# Patient Record
Sex: Male | Born: 1960 | Race: White | Hispanic: No | Marital: Married | Smoking: Never smoker
Health system: Southern US, Community
[De-identification: ages and names within clinical notes are randomized; demographics above are authoritative.]

## PROBLEM LIST (undated history)

## (undated) DIAGNOSIS — K649 Unspecified hemorrhoids: Secondary | ICD-10-CM

## (undated) DIAGNOSIS — R079 Chest pain, unspecified: Secondary | ICD-10-CM

## (undated) DIAGNOSIS — D72819 Decreased white blood cell count, unspecified: Principal | ICD-10-CM

## (undated) DIAGNOSIS — R0602 Shortness of breath: Secondary | ICD-10-CM

## (undated) DIAGNOSIS — M1990 Unspecified osteoarthritis, unspecified site: Secondary | ICD-10-CM

## (undated) DIAGNOSIS — G4730 Sleep apnea, unspecified: Secondary | ICD-10-CM

## (undated) DIAGNOSIS — D509 Iron deficiency anemia, unspecified: Secondary | ICD-10-CM

## (undated) HISTORY — PX: NASAL SINUS SURGERY: SHX719

## (undated) HISTORY — PX: TONSILLECTOMY: SUR1361

## (undated) HISTORY — DX: Iron deficiency anemia, unspecified: D50.9

## (undated) HISTORY — DX: Decreased white blood cell count, unspecified: D72.819

## (undated) HISTORY — DX: Unspecified hemorrhoids: K64.9

## (undated) HISTORY — PX: ROTATOR CUFF REPAIR: SHX139

## (undated) HISTORY — PX: OTHER SURGICAL HISTORY: SHX169

---

## 2000-11-22 ENCOUNTER — Emergency Department (HOSPITAL_COMMUNITY): Admission: EM | Admit: 2000-11-22 | Discharge: 2000-11-22 | Payer: Self-pay | Admitting: *Deleted

## 2006-09-08 ENCOUNTER — Encounter: Payer: Self-pay | Admitting: Gastroenterology

## 2006-09-09 ENCOUNTER — Encounter: Payer: Self-pay | Admitting: Gastroenterology

## 2007-01-25 ENCOUNTER — Encounter: Payer: Self-pay | Admitting: Gastroenterology

## 2007-02-02 ENCOUNTER — Encounter: Payer: Self-pay | Admitting: Gastroenterology

## 2007-02-09 ENCOUNTER — Encounter: Payer: Self-pay | Admitting: Gastroenterology

## 2007-03-03 ENCOUNTER — Encounter: Payer: Self-pay | Admitting: Gastroenterology

## 2007-03-23 ENCOUNTER — Encounter: Payer: Self-pay | Admitting: Gastroenterology

## 2007-03-24 ENCOUNTER — Encounter: Payer: Self-pay | Admitting: Gastroenterology

## 2007-04-10 ENCOUNTER — Encounter: Payer: Self-pay | Admitting: Gastroenterology

## 2007-04-18 ENCOUNTER — Encounter: Payer: Self-pay | Admitting: Gastroenterology

## 2007-05-11 ENCOUNTER — Encounter: Payer: Self-pay | Admitting: Gastroenterology

## 2007-05-21 ENCOUNTER — Encounter: Payer: Self-pay | Admitting: Gastroenterology

## 2007-06-08 ENCOUNTER — Encounter: Payer: Self-pay | Admitting: Gastroenterology

## 2007-06-19 ENCOUNTER — Encounter: Payer: Self-pay | Admitting: Gastroenterology

## 2007-06-26 ENCOUNTER — Encounter: Payer: Self-pay | Admitting: Gastroenterology

## 2007-09-18 ENCOUNTER — Encounter: Payer: Self-pay | Admitting: Gastroenterology

## 2007-09-19 ENCOUNTER — Encounter: Payer: Self-pay | Admitting: Gastroenterology

## 2007-10-05 ENCOUNTER — Encounter: Payer: Self-pay | Admitting: Gastroenterology

## 2007-10-13 ENCOUNTER — Encounter: Payer: Self-pay | Admitting: Gastroenterology

## 2007-11-08 ENCOUNTER — Encounter: Payer: Self-pay | Admitting: Gastroenterology

## 2008-11-26 ENCOUNTER — Encounter: Payer: Self-pay | Admitting: Gastroenterology

## 2009-02-24 ENCOUNTER — Encounter: Payer: Self-pay | Admitting: Gastroenterology

## 2009-04-07 ENCOUNTER — Ambulatory Visit: Payer: Self-pay | Admitting: Gastroenterology

## 2009-04-07 DIAGNOSIS — K644 Residual hemorrhoidal skin tags: Secondary | ICD-10-CM | POA: Insufficient documentation

## 2009-04-07 DIAGNOSIS — D509 Iron deficiency anemia, unspecified: Secondary | ICD-10-CM | POA: Insufficient documentation

## 2009-04-07 LAB — CONVERTED CEMR LAB
Basophils Absolute: 0.1 10*3/uL (ref 0.0–0.1)
Basophils Relative: 1.3 % (ref 0.0–3.0)
Eosinophils Absolute: 0.1 10*3/uL (ref 0.0–0.7)
Eosinophils Relative: 2.2 % (ref 0.0–5.0)
HCT: 38 % — ABNORMAL LOW (ref 39.0–52.0)
Hemoglobin: 12.1 g/dL — ABNORMAL LOW (ref 13.0–17.0)
Lymphocytes Relative: 31.9 % (ref 12.0–46.0)
Lymphs Abs: 1.4 10*3/uL (ref 0.7–4.0)
MCHC: 31.8 g/dL (ref 30.0–36.0)
MCV: 77.3 fL — ABNORMAL LOW (ref 78.0–100.0)
Monocytes Absolute: 0.6 10*3/uL (ref 0.1–1.0)
Monocytes Relative: 12.6 % — ABNORMAL HIGH (ref 3.0–12.0)
Neutro Abs: 2.2 10*3/uL (ref 1.4–7.7)
Neutrophils Relative %: 52 % (ref 43.0–77.0)
Platelets: 195 10*3/uL (ref 150.0–400.0)
RBC: 4.91 M/uL (ref 4.22–5.81)
RDW: 20.3 % — ABNORMAL HIGH (ref 11.5–14.6)
Tissue Transglutaminase Ab, IgA: 0.5 units (ref <7)
WBC: 4.4 10*3/uL — ABNORMAL LOW (ref 4.5–10.5)

## 2009-04-25 ENCOUNTER — Ambulatory Visit (HOSPITAL_COMMUNITY): Admission: RE | Admit: 2009-04-25 | Discharge: 2009-04-25 | Payer: Self-pay | Admitting: Gastroenterology

## 2009-04-25 ENCOUNTER — Encounter: Payer: Self-pay | Admitting: Gastroenterology

## 2009-05-20 ENCOUNTER — Ambulatory Visit: Payer: Self-pay | Admitting: Gastroenterology

## 2009-09-16 ENCOUNTER — Ambulatory Visit (HOSPITAL_COMMUNITY): Admission: RE | Admit: 2009-09-16 | Discharge: 2009-09-16 | Payer: Self-pay | Admitting: Family Medicine

## 2009-09-26 ENCOUNTER — Ambulatory Visit: Payer: Self-pay | Admitting: Hematology and Oncology

## 2009-09-30 ENCOUNTER — Encounter: Payer: Self-pay | Admitting: Gastroenterology

## 2009-09-30 LAB — CBC & DIFF AND RETIC
BASO%: 0.8 % (ref 0.0–2.0)
EOS%: 2.3 % (ref 0.0–7.0)
HCT: 33.1 % — ABNORMAL LOW (ref 38.4–49.9)
Immature Retic Fract: 17.7 % — ABNORMAL HIGH (ref 0.00–13.40)
MCHC: 29.3 g/dL — ABNORMAL LOW (ref 32.0–36.0)
MONO#: 0.3 10*3/uL (ref 0.1–0.9)
RBC: 4.14 10*6/uL — ABNORMAL LOW (ref 4.20–5.82)
RDW: 21 % — ABNORMAL HIGH (ref 11.0–14.6)
Retic %: 2.4 % — ABNORMAL HIGH (ref 0.50–1.60)
WBC: 3.5 10*3/uL — ABNORMAL LOW (ref 4.0–10.3)
lymph#: 1.2 10*3/uL (ref 0.9–3.3)

## 2009-09-30 LAB — URINALYSIS, MICROSCOPIC - CHCC
Blood: NEGATIVE
Ketones: NEGATIVE mg/dL
Protein: <30 mg/dL
Specific Gravity, Urine: 1.02 (ref 1.003–1.035)

## 2009-10-01 LAB — DIRECT ANTIGLOBULIN TEST (NOT AT ARMC)
DAT (Complement): NEGATIVE
DAT IgG: NEGATIVE

## 2009-10-01 LAB — HAPTOGLOBIN: Haptoglobin: 111 mg/dL (ref 16–200)

## 2009-10-02 LAB — VITAMIN B12: Vitamin B-12: 450 pg/mL (ref 211–911)

## 2009-10-02 LAB — PROTEIN ELECTROPHORESIS, SERUM, WITH REFLEX
Albumin ELP: 59.1 % (ref 55.8–66.1)
Alpha-1-Globulin: 4.2 % (ref 2.9–4.9)
Alpha-2-Globulin: 8.2 % (ref 7.1–11.8)
Beta 2: 5.2 % (ref 3.2–6.5)
Beta Globulin: 8.1 % — ABNORMAL HIGH (ref 4.7–7.2)

## 2009-10-02 LAB — IRON AND TIBC
%SAT: 29 % (ref 20–55)
Iron: 146 ug/dL (ref 42–165)
TIBC: 498 ug/dL — ABNORMAL HIGH (ref 215–435)
UIBC: 352 ug/dL

## 2009-10-02 LAB — COMPREHENSIVE METABOLIC PANEL
Alkaline Phosphatase: 64 U/L (ref 39–117)
CO2: 20 mEq/L (ref 19–32)
Creatinine, Ser: 0.9 mg/dL (ref 0.40–1.50)
Glucose, Bld: 105 mg/dL — ABNORMAL HIGH (ref 70–99)
Total Bilirubin: 0.6 mg/dL (ref 0.3–1.2)

## 2009-10-02 LAB — FERRITIN: Ferritin: 8 ng/mL — ABNORMAL LOW (ref 22–322)

## 2009-10-02 LAB — KAPPA/LAMBDA LIGHT CHAINS: Lambda Free Lght Chn: 2.43 mg/dL (ref 0.57–2.63)

## 2009-10-30 ENCOUNTER — Ambulatory Visit: Payer: Self-pay | Admitting: Hematology and Oncology

## 2009-11-03 ENCOUNTER — Ambulatory Visit (HOSPITAL_COMMUNITY): Admission: RE | Admit: 2009-11-03 | Discharge: 2009-11-03 | Payer: Self-pay | Admitting: Hematology and Oncology

## 2009-11-03 LAB — CBC WITH DIFFERENTIAL/PLATELET
Eosinophils Absolute: 0.1 10*3/uL (ref 0.0–0.5)
LYMPH%: 19.9 % (ref 14.0–49.0)
MCHC: 33.4 g/dL (ref 32.0–36.0)
MCV: 82.9 fL (ref 79.3–98.0)
MONO%: 8.1 % (ref 0.0–14.0)
NEUT#: 3.4 10*3/uL (ref 1.5–6.5)
Platelets: 179 10*3/uL (ref 140–400)
RBC: 4.67 10*6/uL (ref 4.20–5.82)

## 2009-11-03 LAB — IRON AND TIBC
%SAT: 20 % (ref 20–55)
TIBC: 347 ug/dL (ref 215–435)
UIBC: 278 ug/dL

## 2009-11-03 LAB — COMPREHENSIVE METABOLIC PANEL
Alkaline Phosphatase: 63 U/L (ref 39–117)
Creatinine, Ser: 1 mg/dL (ref 0.40–1.50)
Glucose, Bld: 90 mg/dL (ref 70–99)
Sodium: 141 mEq/L (ref 135–145)
Total Bilirubin: 0.8 mg/dL (ref 0.3–1.2)
Total Protein: 7.1 g/dL (ref 6.0–8.3)

## 2009-11-03 LAB — FERRITIN: Ferritin: 307 ng/mL (ref 22–322)

## 2009-11-05 ENCOUNTER — Encounter: Payer: Self-pay | Admitting: Gastroenterology

## 2010-01-29 ENCOUNTER — Ambulatory Visit: Payer: Self-pay | Admitting: Hematology and Oncology

## 2010-02-02 LAB — CBC WITH DIFFERENTIAL/PLATELET
BASO%: 1.2 % (ref 0.0–2.0)
EOS%: 1.7 % (ref 0.0–7.0)
MCH: 30.8 pg (ref 27.2–33.4)
MCHC: 34.8 g/dL (ref 32.0–36.0)
RBC: 4.14 10*6/uL — ABNORMAL LOW (ref 4.20–5.82)
RDW: 14.2 % (ref 11.0–14.6)
lymph#: 1.2 10*3/uL (ref 0.9–3.3)

## 2010-02-03 LAB — IRON AND TIBC
TIBC: 392 ug/dL (ref 215–435)
UIBC: 103 ug/dL

## 2010-02-04 ENCOUNTER — Encounter: Payer: Self-pay | Admitting: Gastroenterology

## 2010-04-30 ENCOUNTER — Ambulatory Visit: Payer: Self-pay | Admitting: Hematology and Oncology

## 2010-05-04 LAB — CBC WITH DIFFERENTIAL/PLATELET
Basophils Absolute: 0 10*3/uL (ref 0.0–0.1)
EOS%: 1.5 % (ref 0.0–7.0)
HCT: 40.8 % (ref 38.4–49.9)
HGB: 14.3 g/dL (ref 13.0–17.1)
LYMPH%: 32.1 % (ref 14.0–49.0)
MCH: 31.1 pg (ref 27.2–33.4)
MCV: 88.7 fL (ref 79.3–98.0)
MONO%: 9.8 % (ref 0.0–14.0)
NEUT%: 55.7 % (ref 39.0–75.0)

## 2010-05-04 LAB — VITAMIN B12: Vitamin B-12: 435 pg/mL (ref 211–911)

## 2010-06-15 ENCOUNTER — Ambulatory Visit (HOSPITAL_BASED_OUTPATIENT_CLINIC_OR_DEPARTMENT_OTHER): Payer: Self-pay | Admitting: Hematology and Oncology

## 2010-06-17 ENCOUNTER — Encounter: Payer: Self-pay | Admitting: Gastroenterology

## 2010-06-17 LAB — CBC WITH DIFFERENTIAL/PLATELET
BASO%: 0.7 % (ref 0.0–2.0)
EOS%: 1.1 % (ref 0.0–7.0)
Eosinophils Absolute: 0.1 10*3/uL (ref 0.0–0.5)
LYMPH%: 31.5 % (ref 14.0–49.0)
MCH: 30 pg (ref 27.2–33.4)
MCHC: 34.2 g/dL (ref 32.0–36.0)
MCV: 87.5 fL (ref 79.3–98.0)
MONO%: 7.4 % (ref 0.0–14.0)
NEUT#: 2.6 10*3/uL (ref 1.5–6.5)
Platelets: 203 10*3/uL (ref 140–400)
RBC: 4.24 10*6/uL (ref 4.20–5.82)
RDW: 13 % (ref 11.0–14.6)

## 2010-06-17 LAB — COMPREHENSIVE METABOLIC PANEL
AST: 24 U/L (ref 0–37)
Albumin: 4.5 g/dL (ref 3.5–5.2)
Alkaline Phosphatase: 75 U/L (ref 39–117)
Potassium: 4.1 mEq/L (ref 3.5–5.3)
Sodium: 141 mEq/L (ref 135–145)
Total Bilirubin: 0.6 mg/dL (ref 0.3–1.2)
Total Protein: 7.1 g/dL (ref 6.0–8.3)

## 2010-06-17 LAB — IRON AND TIBC
%SAT: 22 % (ref 20–55)
TIBC: 356 ug/dL (ref 215–435)

## 2010-08-18 NOTE — Letter (Signed)
Summary: [facility]  [facility]   Imported By: Sherian Rein 04/09/2009 13:17:05  _____________________________________________________________________  External Attachment:    Type:   Image     Comment:   External Document

## 2010-08-18 NOTE — Letter (Signed)
[facility]   Imported By: Sherian Rein 11/24/2009 10:07:51  _____________________________________________________________________  External Attachment:    Type:   Image     Comment:   External Document

## 2010-08-18 NOTE — Letter (Signed)
Summary: Letter/[facility]  Letter/[facility]   Imported By: Sherian Rein 04/09/2009 13:33:39  _____________________________________________________________________  External Attachment:    Type:   Image     Comment:   External Document

## 2010-08-18 NOTE — Assessment & Plan Note (Signed)
Summary: EXTERNAL HEMMOROIDS & ANEMIA/YF   History of Present Illness Visit Type: Initial Consult Primary GI MD: Melvia Heaps MD [facility] Primary Provider: Rudi Heap, MD Requesting Provider: Rudi Heap, MD Chief Complaint: Anemia chronic, patient brought all records to be reviewed. Patient is not seeing but a little blood now using the cream Dr. Christell Constant gave him. HX of iron infusions for the anemia. Still having some recal pain.  Andrew Villegas is a pleasant 50 her old white male referred at the request of Dr. Vernon Prey for evaluation of anemia.  He has undergone extensive workup for an iron deficiency anemia including upper and lower endoscopy, small bowel enteroscopy, capsule endoscopy, CT enterography.  Studies  did not demonstrate any specific source for GI bleeding.  This is an ongoing problem for which has been treated for years.  With supplemental iron his counts have increased.  He is taking both iron infusions and oral iron.  His main GI complaint is intermittent rectal bleeding from hemorrhoids.  This may occur one  to 2  times a week.  He is without rectal pain.  He has undergone hematologic  evaluation which concluded that he has iron deficiency.  He is on no gastric irritants including nonsteroidals.  In August, 2010 hemoglobin was 7.8.  Although transfusion was recommended he declined and chose to increase his oral iron.   GI Review of Systems      Denies abdominal pain, acid reflux, belching, bloating, chest pain, dysphagia with liquids, dysphagia with solids, heartburn, loss of appetite, nausea, vomiting, vomiting blood, weight loss, and  weight gain.      Reports hemorrhoids, rectal bleeding, and  rectal pain.     Denies anal fissure, black tarry stools, change in bowel habit, constipation, diarrhea, diverticulosis, fecal incontinence, heme positive stool, irritable bowel syndrome, jaundice, light color stool, and  liver problems. Preventive Screening-Counseling &  Management  Alcohol-Tobacco     Smoking Status: quit  Caffeine-Diet-Exercise     Does Patient Exercise: yes      Drug Use:  no.      Current Medications (verified): 1)  Iron 325 (65 Fe) Mg Tabs (Ferrous Sulfate) .... Take One By Mouth Three Times A Day 2)  Vit C .... Take One By Mouth Once Daily 3)  Vit B12 .... Take One By Mouth Once Daily 4)  Rectal Cream .... As Needed  Allergies (verified): 1)  ! Pcn  Past History:  Past Medical History: external hemorrhoids chronic anemia sleep apena  Past Surgical History: Tonsillectomy left rotator cuff surgery sinus surgery  Family History: Family History of Diabetes: mother, maternal aunts and maternal uncles Family History of Heart Disease: maternal grandmother Family History Brain Cancer: Mother Family History of Colon Cancer:maternal grandfather Family History Lung Cancer: Maternal grandfather  Social History: Occupation: retired Hotel manager Married one son Patient is a former smoker.  Alcohol Use - yes one drink Daily Caffeine Use three per day Illicit Drug Use - no Patient gets regular exercise. Smoking Status:  quit Drug Use:  no Does Patient Exercise:  yes  Review of Systems       The patient complains of allergy/sinus, back pain, change in vision, fatigue, and swelling of feet/legs.  The patient denies anemia, anxiety-new, arthritis/joint pain, blood in urine, breast changes/lumps, confusion, cough, coughing up blood, depression-new, fever, headaches-new, hearing problems, heart murmur, heart rhythm changes, menstrual pain, muscle pains/cramps, night sweats, nosebleeds, pregnancy symptoms, shortness of breath, skin rash, sleeping problems, sore throat, swollen lymph glands, thirst -  excessive , urination - excessive , urination changes/pain, urine leakage, vision changes, and voice change.         All other systems were reviewed and were negative   Vital Signs:  Patient profile:   50 year old male Height:       72 inches Weight:      202.6 pounds BMI:     27.58 Pulse rate:   86 / minute Pulse rhythm:   regular BP sitting:   98 / 58  (right arm) Cuff size:   regular  Vitals Entered By: Harlow Mares CMA Duncan Dull) (April 07, 2009 11:54 AM)  Physical Exam  Additional Exam:  He is a well-developed well-nourished male  skin: anicter  HEENT: normocephalic; PEERLA; no nasal or orpharyngeal abnormalities neck: supple nodes: no cervical adenopathy chest: clear cor:  no murmurs, gallops or rubs abd:  bowel sounds normoactive; no abdominal masses, tenderness, organomegaly rectal: no masses; stool heme negative; external hemorrhoids are present ext: no cyanosis, clubbing, or edema skeletal: no gross skeletal abnormalities neuro: alert, oriented x 3; no focal abnormalities    Impression & Recommendations:  Problem # 1:  IRON DEFICIENCY (ICD-280.9)  His iron deficiency is presumably related to chronic occult GI blood loss.  His overt bleeding from hemorrhoids is unlikely to be the source of his anemia.  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  Recommendations #1 continue iron supplementation 3 times a day.  If he cannot maintain his blood counts with supplementary iron I would consider iron infusions.  Provided that his blood counts are stable and adequate no further workup or therapy necessarily has to be done.  Should he not be able to keep up his blood counts then I would consider complete small bowel balloon enteroscopy with consideration of cauterizing any AVMs.  This regimen will require at least monthly checks of his blood counts.  Finally, celiac disease with iron malabsorption is a consideration.  I will check a celiac panel in case it was not done previously.  Orders: TLB-CBC Platelet - w/Differential (85025-CBCD) T-Sprue Panel (Celiac Disease Aby Eval) (83516x3/[address])  Problem # 2:  EXTERNAL HEMORRHOIDS (ICD-455.3)  He has  limited  bleeding from hemorrhoids.  This could be due to his external hemorrhoid or possibly from internal hemorrhoids.  I explained to the patient that band ligation may help bleeding from internal hemorrhoids but that his external hemorrhoids with have to be surgically excised.  Recommendations #1 sigmoidoscopy with band ligation of his internal hemorrhoids.  The patient decided that is worthwhile trying [place] to see whether he can reduce his bleeding.  Failing  this, I will refer him to surgery for excision of his external hemorrhoids.  Orders: TLB-CBC Platelet - w/Differential (85025-CBCD) T-Sprue Panel (Celiac Disease Aby Eval) (83516x3/[address]) ZFLEX with Banding (ZFL with Band)  Patient Instructions: 1)  Hemorrhoids brochure given.  2)  Your procedure is schdeuled for 04/25/2009 at 12:30 at [facility] Endo department 3)  You will go to the basement for labs today 4)  CC Dr. Vernon Prey 5)  CC Dr. Caryn Bee Ridenhour 6)  The medication list was reviewed and reconciled.  All changed / newly prescribed medications were explained.  A complete medication list was provided to the patient / caregiver.

## 2010-08-18 NOTE — Therapy (Signed)
Summary: Date Range: 03-25-99 to 09-08-06  Date Range: 03-25-99 to 09-08-06   Imported By: Sherian Rein 04/09/2009 12:21:58  _____________________________________________________________________  External Attachment:    Type:   Image     Comment:   External Document

## 2010-08-18 NOTE — Letter (Signed)
Summary: NP/[facility]  NP/[facility]   Imported By: Sherian Rein 04/09/2009 13:25:25  _____________________________________________________________________  External Attachment:    Type:   Image     Comment:   External Document

## 2010-08-18 NOTE — Letter (Signed)
[facility]  [facility]   Imported By: Sherian Rein 04/09/2009 13:20:27  _____________________________________________________________________  External Attachment:    Type:   Image     Comment:   External Document

## 2010-08-18 NOTE — Letter (Signed)
Summary: Medical & Work History- Date Range: 02-23-02 to 09-08-06  Medical & Work History- Date Range: 02-23-02 to 09-08-06   Imported By: Sherian Rein 04/09/2009 13:13:23  _____________________________________________________________________  External Attachment:    Type:   Image     Comment:   External Document

## 2010-08-18 NOTE — Letter (Signed)
Summary: Throat & Sleep Apnea/[facility]  Throat & Sleep Apnea/[facility]   Imported By: Sherian Rein 04/09/2009 13:38:19  _____________________________________________________________________  External Attachment:    Type:   Image     Comment:   External Document

## 2010-08-18 NOTE — Procedures (Signed)
Summary: Flex Sig   Flexible Sigmoidoscopy  Procedure date:  04/25/2009  Findings:      Location: [facility] FLEXIBLE SIGMOIDOSCOPY PROCEDURE REPORT  PATIENT:  Andrew Villegas, Andrew Villegas  MR#:  [id number] BIRTHDATE:   1961/05/01, 48 yrs. old   GENDER:   male  ENDOSCOPIST:   [name]. [name], MD Referred by:   PROCEDURE DATE:  04/25/2009 PROCEDURE:  Flexible Sigmoidoscopy with banding ASA CLASS:   Class II INDICATIONS: treatment of hemorrhoids   MEDICATIONS:    Fentanyl 75 mcg IV, Versed 7 mg  DESCRIPTION OF PROCEDURE:   After the risks benefits and alternatives of the procedure were thoroughly explained, informed consent was obtained.  No rectal exam performed. The [id number] ([id number]) endoscope was introduced through the anus and advanced to the descending colon, without limitations.  The quality of the prep was .  The instrument was then slowly withdrawn as the mucosa was fully examined. <<PROCEDUREIMAGES>><<OLD IMAGES>>  Internal hemorrhoids were found (see image001). hemorrhoidal banding 7 bands were placed circumferentially around the anus above the dentate line  The examination was otherwise normal.   Retroflexed views in the rectum revealed no abnormalities.    The scope was then withdrawn from the patient and the procedure terminated.  COMPLICATIONS:   None  ENDOSCOPIC IMPRESSION:  1) Internal hemorrhoids - s/p band ligation  2) Otherwise normal examination. RECOMMENDATIONS:  1) Call the office to schedule a followup office visit for 1 month  REPEAT EXAM:   No   _______________________________ [name]. [name], MD   CC: Rudi Heap, MD

## 2010-08-18 NOTE — Letter (Signed)
Summary: [facility]   Imported By: Sherian Rein 04/09/2009 11:50:10  _____________________________________________________________________  External Attachment:    Type:   Image     Comment:   External Document

## 2010-08-18 NOTE — Letter (Signed)
[facility]   Imported By: Sherian Rein 10/21/2009 12:19:47  _____________________________________________________________________  External Attachment:    Type:   Image     Comment:   External Document

## 2010-08-18 NOTE — Letter (Signed)
[facility]   Imported By: Sherian Rein 02/19/2010 13:25:09  _____________________________________________________________________  External Attachment:    Type:   Image     Comment:   External Document

## 2010-08-20 NOTE — Letter (Signed)
Summary: [facility]  [facility]   Imported By: Sherian Rein 06/25/2010 14:46:15  _____________________________________________________________________  External Attachment:    Type:   Image     Comment:   External Document

## 2010-09-16 ENCOUNTER — Encounter: Payer: Self-pay | Admitting: Hematology and Oncology

## 2010-09-16 DIAGNOSIS — D509 Iron deficiency anemia, unspecified: Secondary | ICD-10-CM

## 2010-09-16 LAB — IRON AND TIBC
%SAT: 13 % — ABNORMAL LOW (ref 20–55)
Iron: 51 ug/dL (ref 42–165)
UIBC: 328 ug/dL

## 2010-09-16 LAB — CBC WITH DIFFERENTIAL/PLATELET
BASO%: 0.8 % (ref 0.0–2.0)
Basophils Absolute: 0 10*3/uL (ref 0.0–0.1)
EOS%: 2.9 % (ref 0.0–7.0)
HCT: 40.6 % (ref 38.4–49.9)
HGB: 13.3 g/dL (ref 13.0–17.1)
MCH: 27.7 pg (ref 27.2–33.4)
MCHC: 32.8 g/dL (ref 32.0–36.0)
MONO#: 0.4 10*3/uL (ref 0.1–0.9)
NEUT%: 52 % (ref 39.0–75.0)
RDW: 12.5 % (ref 11.0–14.6)
WBC: 3.8 10*3/uL — ABNORMAL LOW (ref 4.0–10.3)
lymph#: 1.4 10*3/uL (ref 0.9–3.3)

## 2010-09-16 LAB — BASIC METABOLIC PANEL
BUN: 9 mg/dL (ref 6–23)
Chloride: 103 mEq/L (ref 96–112)
Potassium: 3.8 mEq/L (ref 3.5–5.3)
Sodium: 138 mEq/L (ref 135–145)

## 2010-09-18 ENCOUNTER — Encounter (HOSPITAL_BASED_OUTPATIENT_CLINIC_OR_DEPARTMENT_OTHER): Payer: BC Managed Care – PPO | Admitting: Hematology and Oncology

## 2010-09-18 DIAGNOSIS — K922 Gastrointestinal hemorrhage, unspecified: Secondary | ICD-10-CM

## 2010-09-18 DIAGNOSIS — D500 Iron deficiency anemia secondary to blood loss (chronic): Secondary | ICD-10-CM

## 2010-09-25 ENCOUNTER — Encounter (HOSPITAL_BASED_OUTPATIENT_CLINIC_OR_DEPARTMENT_OTHER): Payer: BC Managed Care – PPO | Admitting: Hematology and Oncology

## 2010-09-25 DIAGNOSIS — D500 Iron deficiency anemia secondary to blood loss (chronic): Secondary | ICD-10-CM

## 2010-09-25 DIAGNOSIS — K922 Gastrointestinal hemorrhage, unspecified: Secondary | ICD-10-CM

## 2010-10-02 ENCOUNTER — Encounter (HOSPITAL_BASED_OUTPATIENT_CLINIC_OR_DEPARTMENT_OTHER): Payer: BC Managed Care – PPO | Admitting: Hematology and Oncology

## 2010-10-02 DIAGNOSIS — K922 Gastrointestinal hemorrhage, unspecified: Secondary | ICD-10-CM

## 2010-10-02 DIAGNOSIS — D500 Iron deficiency anemia secondary to blood loss (chronic): Secondary | ICD-10-CM

## 2010-10-09 LAB — CROSSMATCH
ABO/RH(D): O NEG
Antibody Screen: NEGATIVE

## 2010-11-11 ENCOUNTER — Encounter (HOSPITAL_BASED_OUTPATIENT_CLINIC_OR_DEPARTMENT_OTHER): Payer: BC Managed Care – PPO | Admitting: Hematology and Oncology

## 2010-11-11 ENCOUNTER — Other Ambulatory Visit: Payer: Self-pay | Admitting: Hematology and Oncology

## 2010-11-11 DIAGNOSIS — D509 Iron deficiency anemia, unspecified: Secondary | ICD-10-CM

## 2010-11-11 LAB — CBC WITH DIFFERENTIAL/PLATELET
Basophils Absolute: 0 10*3/uL (ref 0.0–0.1)
Eosinophils Absolute: 0.1 10*3/uL (ref 0.0–0.5)
HCT: 35.3 % — ABNORMAL LOW (ref 38.4–49.9)
HGB: 11.9 g/dL — ABNORMAL LOW (ref 13.0–17.1)
MCV: 88.3 fL (ref 79.3–98.0)
MONO%: 10.7 % (ref 0.0–14.0)
NEUT#: 2 10*3/uL (ref 1.5–6.5)
RDW: 15.2 % — ABNORMAL HIGH (ref 11.0–14.6)
lymph#: 1.2 10*3/uL (ref 0.9–3.3)

## 2010-11-11 LAB — BASIC METABOLIC PANEL
CO2: 25 mEq/L (ref 19–32)
Glucose, Bld: 83 mg/dL (ref 70–99)
Potassium: 3.9 mEq/L (ref 3.5–5.3)
Sodium: 140 mEq/L (ref 135–145)

## 2010-11-11 LAB — IRON AND TIBC: %SAT: 18 % — ABNORMAL LOW (ref 20–55)

## 2010-11-11 LAB — FERRITIN: Ferritin: 121 ng/mL (ref 22–322)

## 2010-11-13 ENCOUNTER — Encounter (HOSPITAL_BASED_OUTPATIENT_CLINIC_OR_DEPARTMENT_OTHER): Payer: BC Managed Care – PPO | Admitting: Hematology and Oncology

## 2010-11-13 DIAGNOSIS — D500 Iron deficiency anemia secondary to blood loss (chronic): Secondary | ICD-10-CM

## 2010-11-13 DIAGNOSIS — K649 Unspecified hemorrhoids: Secondary | ICD-10-CM

## 2011-04-16 ENCOUNTER — Encounter (HOSPITAL_BASED_OUTPATIENT_CLINIC_OR_DEPARTMENT_OTHER): Payer: Federal, State, Local not specified - PPO | Admitting: Hematology and Oncology

## 2011-04-16 ENCOUNTER — Other Ambulatory Visit: Payer: Self-pay | Admitting: Hematology and Oncology

## 2011-04-16 DIAGNOSIS — D509 Iron deficiency anemia, unspecified: Secondary | ICD-10-CM

## 2011-04-16 DIAGNOSIS — K922 Gastrointestinal hemorrhage, unspecified: Secondary | ICD-10-CM

## 2011-04-16 DIAGNOSIS — D500 Iron deficiency anemia secondary to blood loss (chronic): Secondary | ICD-10-CM

## 2011-04-16 LAB — CBC WITH DIFFERENTIAL/PLATELET
Basophils Absolute: 0 10*3/uL (ref 0.0–0.1)
Eosinophils Absolute: 0.1 10*3/uL (ref 0.0–0.5)
HGB: 12.3 g/dL — ABNORMAL LOW (ref 13.0–17.1)
NEUT#: 2.9 10*3/uL (ref 1.5–6.5)
RDW: 13 % (ref 11.0–14.6)
lymph#: 1.4 10*3/uL (ref 0.9–3.3)

## 2011-04-18 LAB — IRON AND TIBC
%SAT: 7 % — ABNORMAL LOW (ref 20–55)
Iron: 31 ug/dL — ABNORMAL LOW (ref 42–165)

## 2011-04-18 LAB — FERRITIN: Ferritin: 8 ng/mL — ABNORMAL LOW (ref 22–322)

## 2011-04-18 LAB — BASIC METABOLIC PANEL
CO2: 27 mEq/L (ref 19–32)
Glucose, Bld: 80 mg/dL (ref 70–99)
Potassium: 4 mEq/L (ref 3.5–5.3)
Sodium: 138 mEq/L (ref 135–145)

## 2011-04-20 ENCOUNTER — Encounter (HOSPITAL_BASED_OUTPATIENT_CLINIC_OR_DEPARTMENT_OTHER): Payer: Federal, State, Local not specified - PPO | Admitting: Hematology and Oncology

## 2011-04-20 DIAGNOSIS — K649 Unspecified hemorrhoids: Secondary | ICD-10-CM

## 2011-04-20 DIAGNOSIS — D500 Iron deficiency anemia secondary to blood loss (chronic): Secondary | ICD-10-CM

## 2011-05-06 ENCOUNTER — Encounter (HOSPITAL_BASED_OUTPATIENT_CLINIC_OR_DEPARTMENT_OTHER): Payer: Federal, State, Local not specified - PPO | Admitting: Hematology and Oncology

## 2011-05-06 DIAGNOSIS — K649 Unspecified hemorrhoids: Secondary | ICD-10-CM

## 2011-05-06 DIAGNOSIS — D500 Iron deficiency anemia secondary to blood loss (chronic): Secondary | ICD-10-CM

## 2011-05-11 ENCOUNTER — Encounter (HOSPITAL_BASED_OUTPATIENT_CLINIC_OR_DEPARTMENT_OTHER): Payer: Federal, State, Local not specified - PPO | Admitting: Hematology and Oncology

## 2011-05-11 DIAGNOSIS — K649 Unspecified hemorrhoids: Secondary | ICD-10-CM

## 2011-05-11 DIAGNOSIS — D500 Iron deficiency anemia secondary to blood loss (chronic): Secondary | ICD-10-CM

## 2011-10-12 ENCOUNTER — Telehealth: Payer: Self-pay | Admitting: Hematology and Oncology

## 2011-10-12 NOTE — Telephone Encounter (Signed)
S/w pt today re appts for 4/2 and 4/5.

## 2011-10-15 ENCOUNTER — Other Ambulatory Visit: Payer: Self-pay | Admitting: Hematology and Oncology

## 2011-10-19 ENCOUNTER — Other Ambulatory Visit (HOSPITAL_BASED_OUTPATIENT_CLINIC_OR_DEPARTMENT_OTHER): Payer: Federal, State, Local not specified - PPO | Admitting: Lab

## 2011-10-19 DIAGNOSIS — D500 Iron deficiency anemia secondary to blood loss (chronic): Secondary | ICD-10-CM

## 2011-10-19 DIAGNOSIS — K649 Unspecified hemorrhoids: Secondary | ICD-10-CM

## 2011-10-19 LAB — CBC WITH DIFFERENTIAL/PLATELET
BASO%: 0.9 % (ref 0.0–2.0)
Basophils Absolute: 0 10*3/uL (ref 0.0–0.1)
EOS%: 1.6 % (ref 0.0–7.0)
HGB: 14.1 g/dL (ref 13.0–17.1)
MCH: 30.1 pg (ref 27.2–33.4)
NEUT#: 2.2 10*3/uL (ref 1.5–6.5)
RBC: 4.68 10*6/uL (ref 4.20–5.82)
RDW: 13.2 % (ref 11.0–14.6)
lymph#: 1 10*3/uL (ref 0.9–3.3)
nRBC: 0 % (ref 0–0)

## 2011-10-19 LAB — IRON AND TIBC
%SAT: 45 % (ref 20–55)
Iron: 165 ug/dL (ref 42–165)
TIBC: 365 ug/dL (ref 215–435)
UIBC: 200 ug/dL (ref 125–400)

## 2011-10-19 LAB — BASIC METABOLIC PANEL
Glucose, Bld: 76 mg/dL (ref 70–99)
Potassium: 3.6 mEq/L (ref 3.5–5.3)
Sodium: 143 mEq/L (ref 135–145)

## 2011-10-19 LAB — FERRITIN: Ferritin: 33 ng/mL (ref 22–322)

## 2011-10-22 ENCOUNTER — Ambulatory Visit (HOSPITAL_BASED_OUTPATIENT_CLINIC_OR_DEPARTMENT_OTHER): Payer: Federal, State, Local not specified - PPO

## 2011-10-22 ENCOUNTER — Encounter: Payer: Self-pay | Admitting: Hematology and Oncology

## 2011-10-22 ENCOUNTER — Ambulatory Visit (HOSPITAL_BASED_OUTPATIENT_CLINIC_OR_DEPARTMENT_OTHER): Payer: Federal, State, Local not specified - PPO | Admitting: Hematology and Oncology

## 2011-10-22 ENCOUNTER — Other Ambulatory Visit: Payer: Self-pay | Admitting: *Deleted

## 2011-10-22 ENCOUNTER — Telehealth: Payer: Self-pay | Admitting: Hematology and Oncology

## 2011-10-22 VITALS — BP 102/61 | HR 65 | Temp 97.2°F | Ht 72.0 in | Wt 198.6 lb

## 2011-10-22 DIAGNOSIS — K649 Unspecified hemorrhoids: Secondary | ICD-10-CM

## 2011-10-22 DIAGNOSIS — D508 Other iron deficiency anemias: Secondary | ICD-10-CM

## 2011-10-22 DIAGNOSIS — K922 Gastrointestinal hemorrhage, unspecified: Secondary | ICD-10-CM

## 2011-10-22 DIAGNOSIS — D509 Iron deficiency anemia, unspecified: Secondary | ICD-10-CM

## 2011-10-22 DIAGNOSIS — D539 Nutritional anemia, unspecified: Secondary | ICD-10-CM

## 2011-10-22 MED ORDER — SODIUM CHLORIDE 0.9 % IV SOLN
Freq: Once | INTRAVENOUS | Status: AC
  Administered 2011-10-22: 11:00:00 via INTRAVENOUS

## 2011-10-22 MED ORDER — SODIUM CHLORIDE 0.9 % IJ SOLN
10.0000 mL | INTRAMUSCULAR | Status: DC | PRN
  Filled 2011-10-22: qty 10

## 2011-10-22 MED ORDER — FE FUM-VIT C-VIT B12-FA 460-60-0.01-1 MG PO CAPS: 1.0000 | ORAL_CAPSULE | Freq: Every day | ORAL | Status: DC

## 2011-10-22 MED ORDER — SODIUM CHLORIDE 0.9 % IV SOLN
1020.0000 mg | Freq: Once | INTRAVENOUS | Status: AC
  Administered 2011-10-22: 1020 mg via INTRAVENOUS
  Filled 2011-10-22: qty 34

## 2011-10-22 NOTE — Progress Notes (Signed)
This office note has been dictated.

## 2011-10-22 NOTE — Telephone Encounter (Signed)
Per michelle to gve the pt his oct 2013 appt schedule

## 2011-10-22 NOTE — Progress Notes (Signed)
CC:   Andrew Villegas, M.D. [name]. [name], MD,FACG  IDENTIFYING STATEMENT:  The patient is a 51 year old man with iron- deficiency anemia who presents for followup.  INTERVAL HISTORY:  The patient was last seen 6 months ago.  He currently reports fatigue.  He has not experienced any rectal bleeding.  He takes Hematogen Forte iron supplementation.  ALLERGIES:  Penicillin.  PAST MEDICAL HISTORY/FAMILY HISTORY/SOCIAL HISTORY:  Unchanged.  REVIEW OF SYSTEMS:  Ten point review of systems negative.  PHYSICAL EXAM:  The patient is a well-appearing, well-nourished man in no distress.  Vitals:  Pulse 65, blood pressure 102/61, temperature 97.2, respirations 18, weight 198.6 pounds.  HEENT:  Head is atraumatic, normocephalic.  Sclerae anicteric.  Mouth moist.  Chest:  Clear.  CVS: Unremarkable.  Abdomen:  Soft, nontender.  Bowel sounds present. Extremities:  No edema or calf tenderness.  LAB DATA:  10/19/2011 white cell count 3.6, hemoglobin 14.1, hematocrit 31.6, platelets 135.  Sodium 143, potassium 3.6, chloride 108, CO2 27, BUN 10, creatinine 0.9, glucose 76, calcium 9.7, iron 165, TIBC 365, saturation 45%, ferritin 33 (8).  IMPRESSION/PLAN:  Andrew Villegas is a 51 year old man with iron-deficiency anemia secondary to gastrointestinal bleeding from hemorrhoids.  He last received Feraheme 6 months ago.  His ferritin levels are a little on the low side and he is agreeable to have his iron stores replenished.  He will follow up in 6 months' time.  He is to continue oral iron.    ______________________________ Laurice Record, M.D. LIO/MEDQ  D:  10/22/2011  T:  10/22/2011  Job:  [id number]

## 2011-11-04 ENCOUNTER — Other Ambulatory Visit: Payer: Self-pay | Admitting: *Deleted

## 2011-11-04 DIAGNOSIS — D509 Iron deficiency anemia, unspecified: Secondary | ICD-10-CM

## 2011-11-04 MED ORDER — POLYSACCHARIDE IRON COMPLEX 150 MG PO CAPS: 150.0000 mg | ORAL_CAPSULE | Freq: Every day | ORAL | Status: DC

## 2012-04-21 ENCOUNTER — Other Ambulatory Visit (HOSPITAL_BASED_OUTPATIENT_CLINIC_OR_DEPARTMENT_OTHER): Payer: Federal, State, Local not specified - PPO | Admitting: Lab

## 2012-04-21 DIAGNOSIS — D539 Nutritional anemia, unspecified: Secondary | ICD-10-CM

## 2012-04-21 LAB — BASIC METABOLIC PANEL (CC13)
CO2: 25 mEq/L (ref 22–29)
Calcium: 9.3 mg/dL (ref 8.4–10.4)
Chloride: 107 mEq/L (ref 98–107)
Creatinine: 1 mg/dL (ref 0.7–1.3)
Glucose: 91 mg/dl (ref 70–99)

## 2012-04-21 LAB — CBC WITH DIFFERENTIAL/PLATELET
BASO%: 1 % (ref 0.0–2.0)
EOS%: 2.9 % (ref 0.0–7.0)
MCH: 30.6 pg (ref 27.2–33.4)
MCHC: 34.2 g/dL (ref 32.0–36.0)
MCV: 89.4 fL (ref 79.3–98.0)
MONO%: 11.4 % (ref 0.0–14.0)
RBC: 4.93 10*6/uL (ref 4.20–5.82)
RDW: 12.7 % (ref 11.0–14.6)
lymph#: 1.1 10*3/uL (ref 0.9–3.3)

## 2012-04-21 LAB — IRON AND TIBC
TIBC: 316 ug/dL (ref 215–435)
UIBC: 217 ug/dL (ref 125–400)

## 2012-04-24 ENCOUNTER — Encounter: Payer: Self-pay | Admitting: *Deleted

## 2012-04-25 ENCOUNTER — Telehealth: Payer: Self-pay | Admitting: Hematology and Oncology

## 2012-04-25 ENCOUNTER — Ambulatory Visit: Payer: Federal, State, Local not specified - PPO

## 2012-04-25 ENCOUNTER — Ambulatory Visit (HOSPITAL_BASED_OUTPATIENT_CLINIC_OR_DEPARTMENT_OTHER): Payer: Federal, State, Local not specified - PPO | Admitting: Hematology and Oncology

## 2012-04-25 ENCOUNTER — Encounter: Payer: Self-pay | Admitting: Hematology and Oncology

## 2012-04-25 VITALS — BP 109/62 | HR 71 | Temp 97.0°F | Resp 20 | Ht 72.0 in | Wt 195.0 lb

## 2012-04-25 DIAGNOSIS — D539 Nutritional anemia, unspecified: Secondary | ICD-10-CM

## 2012-04-25 DIAGNOSIS — K922 Gastrointestinal hemorrhage, unspecified: Secondary | ICD-10-CM

## 2012-04-25 DIAGNOSIS — D500 Iron deficiency anemia secondary to blood loss (chronic): Secondary | ICD-10-CM

## 2012-04-25 NOTE — Patient Instructions (Addendum)
Andrew Villegas  [id number]   [facility] - AFTER VISIT SUMMARY   **RECOMMENDATIONS MADE BY THE CONSULTANT AND ANY TEST    RESULTS WILL BE SENT TO YOUR REFERRING DOCTORS.   YOUR EXAM FINDINGS, LABS AND RESULTS WERE DISCUSSED BY YOUR MD TODAY.  YOU CAN GO TO THE [facility] WEB SITE FOR INSTRUCTIONS ON HOW TO ASSESS MY CHART FOR ADDITIONAL INFORMATION AS NEEDED.  Your Updated drug allergies are: Allergies as of 04/25/2012 - Review Complete 04/25/2012  Allergen Reaction Noted  . Penicillins      Your current list of medications are: Current Outpatient Prescriptions  Medication Sig Dispense Refill  . fluticasone (FLONASE) 50 MCG/ACT nasal spray Place 1 spray into the nose daily. 1 spray each nostril daily.      Marland Kitchen loratadine (CLARITIN) 10 MG tablet Take 10 mg by mouth daily.      . Fe Fum-Vit C-Vit B12-FA (HEMATOGEN FORTE) 460-60-0.01-1 MG CAPS Take 1 capsule by mouth daily.  90 capsule  3  . iron polysaccharides (NIFEREX) 150 MG capsule Take 1 capsule (150 mg total) by mouth daily.  30 capsule  3     INSTRUCTIONS GIVEN AND DISCUSSED:  See attached schedule   SPECIAL INSTRUCTIONS/FOLLOW-UP:  See above.  I acknowledge that I have been informed and understand all the instructions given to me and received a copy.I know to contact the clinic, my physician, or go to the emergency Department if any problems should occur.   I do not have any more questions at this time, but understand that I may call the [facility] at [phone] during business hours should I have any further questions or need assistance in obtaining follow-up care.

## 2012-04-25 NOTE — Progress Notes (Signed)
This office note has been dictated.

## 2012-04-25 NOTE — Progress Notes (Signed)
CC:   Ernestina Penna, M.D. [name]. [name], MD,FACG  IDENTIFYING STATEMENT:  The patient is a 51 year old man with iron- deficiency anemia who presents for followup.  INTERVAL HISTORY:  Mr. Glace was last seen in April of this year and on 10/22/2011 he received Feraheme.  He has no concerns to report at this time.  He has not experienced any further rectal bleeding.  His weight is stable.  He is not taking oral iron supplementation.  MEDICATIONS:  Reviewed and updated.  ALLERGIES:  Penicillin.  PHYSICAL EXAMINATION:  The patient is a well-appearing, well-nourished man in no distress.  Vitals:  Pulse 91, blood pressure 109/62, respirations 20, weight 165 pounds.  HEENT:  Head is atraumatic, normocephalic.  Sclerae anicteric.  Mouth moist.  Chest:  Clear.  CVS: Unremarkable.  Abdomen:  Soft, nontender.  Bowel sounds present. Extremities:  No edema.  LABORATORY FINDINGS:  04/21/2012, white cell count 3.5, hemoglobin 15.1, hematocrit 44.1, platelets 152, ferritin 113 (33), iron 99, TIBC 216, saturation 31%.  Sodium 140, potassium 4.1, chloride 107, CO2 of 25, BUN 11, creatinine 1, calcium 9.3.  IMPRESSION AND PLAN:  Mr. Brannigan is a 51 year old man with iron- deficiency anemia secondary to gastrointestinal bleeding from hemorrhoids.  He was last transfused with Feraheme on 10/22/2011.  His ferritin levels are more than adequate so at this time we will hold iron and he follows up in 4 months' time with labs.    ______________________________ Laurice Record, M.D. LIO/MEDQ  D:  04/25/2012  T:  04/25/2012  Job:  [id number]

## 2012-04-25 NOTE — Telephone Encounter (Signed)
Gave pt appt for March 2014 la before MD visit

## 2012-06-23 ENCOUNTER — Other Ambulatory Visit: Payer: Self-pay | Admitting: Hematology and Oncology

## 2012-08-19 ENCOUNTER — Telehealth: Payer: Self-pay | Admitting: Oncology

## 2012-08-19 ENCOUNTER — Encounter: Payer: Self-pay | Admitting: Oncology

## 2012-08-19 NOTE — Telephone Encounter (Signed)
S/W THE PT REGARDING THE RE-ASSIGNING OF HIS MD FROM DR ODOGWU TO DR HA. PT IS AWARE THAT HE WILL BE RECEIVING A LETTER AND A CALENDAR IN THE MAIL.

## 2012-09-21 ENCOUNTER — Ambulatory Visit (HOSPITAL_BASED_OUTPATIENT_CLINIC_OR_DEPARTMENT_OTHER): Payer: Federal, State, Local not specified - PPO | Admitting: Lab

## 2012-09-21 DIAGNOSIS — D539 Nutritional anemia, unspecified: Secondary | ICD-10-CM

## 2012-09-21 LAB — CBC WITH DIFFERENTIAL/PLATELET
BASO%: 1.2 % (ref 0.0–2.0)
Basophils Absolute: 0 10*3/uL (ref 0.0–0.1)
EOS%: 3.3 % (ref 0.0–7.0)
HCT: 33.4 % — ABNORMAL LOW (ref 38.4–49.9)
HGB: 11.1 g/dL — ABNORMAL LOW (ref 13.0–17.1)
MCH: 27.3 pg (ref 27.2–33.4)
MCHC: 33.4 g/dL (ref 32.0–36.0)
MCV: 81.7 fL (ref 79.3–98.0)
MONO%: 10.4 % (ref 0.0–14.0)
NEUT%: 51.7 % (ref 39.0–75.0)
RDW: 14.1 % (ref 11.0–14.6)
lymph#: 1.4 10*3/uL (ref 0.9–3.3)

## 2012-09-21 LAB — IRON AND TIBC
Iron: 26 ug/dL — ABNORMAL LOW (ref 42–165)
UIBC: 448 ug/dL — ABNORMAL HIGH (ref 125–400)

## 2012-09-21 LAB — BASIC METABOLIC PANEL (CC13)
BUN: 14.4 mg/dL (ref 7.0–26.0)
Chloride: 104 mEq/L (ref 98–107)
Creatinine: 1 mg/dL (ref 0.7–1.3)

## 2012-09-21 LAB — FOLATE: Folate: 12.4 ng/mL

## 2012-09-22 ENCOUNTER — Other Ambulatory Visit: Payer: Federal, State, Local not specified - PPO

## 2012-09-27 ENCOUNTER — Ambulatory Visit (HOSPITAL_BASED_OUTPATIENT_CLINIC_OR_DEPARTMENT_OTHER): Payer: Federal, State, Local not specified - PPO

## 2012-09-27 ENCOUNTER — Ambulatory Visit: Payer: Federal, State, Local not specified - PPO | Admitting: Hematology and Oncology

## 2012-09-27 ENCOUNTER — Encounter: Payer: Self-pay | Admitting: Oncology

## 2012-09-27 ENCOUNTER — Telehealth: Payer: Self-pay | Admitting: Oncology

## 2012-09-27 ENCOUNTER — Ambulatory Visit (HOSPITAL_BASED_OUTPATIENT_CLINIC_OR_DEPARTMENT_OTHER): Payer: Federal, State, Local not specified - PPO | Admitting: Oncology

## 2012-09-27 VITALS — BP 101/64 | HR 70 | Temp 97.3°F | Resp 18 | Ht 72.0 in | Wt 202.8 lb

## 2012-09-27 DIAGNOSIS — D509 Iron deficiency anemia, unspecified: Secondary | ICD-10-CM

## 2012-09-27 DIAGNOSIS — D500 Iron deficiency anemia secondary to blood loss (chronic): Secondary | ICD-10-CM

## 2012-09-27 DIAGNOSIS — R109 Unspecified abdominal pain: Secondary | ICD-10-CM

## 2012-09-27 MED ORDER — SODIUM CHLORIDE 0.9 % IV SOLN
1020.0000 mg | Freq: Once | INTRAVENOUS | Status: AC
  Administered 2012-09-27: 1020 mg via INTRAVENOUS
  Filled 2012-09-27: qty 34

## 2012-09-27 NOTE — Telephone Encounter (Signed)
Gave pt appt for lab and ML March 2015

## 2012-09-27 NOTE — Patient Instructions (Addendum)
Ferumoxytol injection What is this medicine? FERUMOXYTOL is an iron complex. Iron is used to make healthy red blood cells, which carry oxygen and nutrients throughout the body. This medicine is used to treat iron deficiency anemia in people with chronic kidney disease. This medicine may be used for other purposes; ask your health care provider or pharmacist if you have questions. What should I tell my health care provider before I take this medicine? They need to know if you have any of these conditions: -anemia not caused by low iron levels -high levels of iron in the blood -magnetic resonance imaging (MRI) test scheduled -an unusual or allergic reaction to iron, other medicines, foods, dyes, or preservatives -pregnant or trying to get pregnant -breast-feeding How should I use this medicine? This medicine is for infusion into a vein. It is given by a health care professional in a hospital or clinic setting. Talk to your pediatrician regarding the use of this medicine in children. Special care may be needed. Overdosage: If you think you've taken too much of this medicine contact a poison control center or emergency room at once. Overdosage: If you think you have taken too much of this medicine contact a poison control center or emergency room at once. NOTE: This medicine is only for you. Do not share this medicine with others. What if I miss a dose? It is important not to miss your dose. Call your doctor or health care professional if you are unable to keep an appointment. What may interact with this medicine? This medicine may interact with the following medications: -other iron products This list may not describe all possible interactions. Give your health care provider a list of all the medicines, herbs, non-prescription drugs, or dietary supplements you use. Also tell them if you smoke, drink alcohol, or use illegal drugs. Some items may interact with your medicine. What should I watch  for while using this medicine? Visit your doctor or healthcare professional regularly. Tell your doctor or healthcare professional if your symptoms do not start to get better or if they get worse. You may need blood work done while you are taking this medicine. You may need to follow a special diet. Talk to your doctor. Foods that contain iron include: whole grains/cereals, dried fruits, beans, or peas, leafy green vegetables, and organ meats (liver, kidney). What side effects may I notice from receiving this medicine? Side effects that you should report to your doctor or health care professional as soon as possible: -allergic reactions like skin rash, itching or hives, swelling of the face, lips, or tongue -breathing problems -changes in blood pressure -feeling faint or lightheaded, falls -fever or chills -flushing, sweating, or hot feelings -swelling of the ankles or feet Side effects that usually do not require medical attention (Report these to your doctor or health care professional if they continue or are bothersome.): -diarrhea -headache -nausea, vomiting -stomach pain This list may not describe all possible side effects. Call your doctor for medical advice about side effects. You may report side effects to FDA at 1-800-FDA-1088. Where should I keep my medicine? This drug is given in a hospital or clinic and will not be stored at home. NOTE: This sheet is a summary. It may not cover all possible information. If you have questions about this medicine, talk to your doctor, pharmacist, or health care provider.  2012, [employer]. (03/27/2008 9:48:25 PM) 

## 2012-09-27 NOTE — Patient Instructions (Addendum)
1.  Iron deficiency anemia:  Since there is anemia and Iron deficiency. I recommend IV iron in addition to continuing oral iron.  Increase oral iron to twice daily along with OJ or Vit C to improve absorption.  2.  Follow up:  Blood check in 4 and 8 months to ensure improving hemoglobin.  Return visit in about 1 year. 3.  Right flank pain:  CT scan abdomen within 1 week.

## 2012-09-27 NOTE — Progress Notes (Signed)
[facility]  [phone] [phone]   OFFICE PROGRESS NOTE   Cc:  Rudi Heap, MD  DIAGNOSIS:  Iron deficiency anemia due to either chronic hemorrhoid or malabsorption.  He reportedly had negative EGD and colonoscopy in 2010.   CURRENT THERAPY: oral iron once daily and IV iron prn.   INTERVAL HISTORY: Andrew Villegas 52 y.o. male returns for regular follow up by himself.  He used to see Dr. Dalene Carrow who is no longer with the practice.  I assumed his care starting today.  He reported mild fatigue.  However, he is still independent of all activities of daily living.  He has intermittent hematochezia which he attributes to both internal and external hemorrhoids.  He denied any other visible source of bleeding. He complained of right flank pain for the past 3-4 months.  He had chronic back pain in the past which was low in intensity and was bilateral.  Now, the right flank pain has been severe, at time brought him down to the floor.  He saw his PCP about this 3 months ago and had negative UA.  Pain is crampy and sharp.  It is non-radiating.  There is no relation with activity, bowel/bladder activities.   Patient denies fever, anorexia, weight loss, headache, visual changes, confusion, drenching night sweats, palpable lymph node swelling, mucositis, odynophagia, dysphagia, nausea vomiting, jaundice, chest pain, palpitation, shortness of breath, dyspnea on exertion, productive cough, gum bleeding, epistaxis, hematemesis, hemoptysis, abdominal pain, abdominal swelling, early satiety, melena, hematuria, skin rash, spontaneous bleeding, joint swelling, joint pain, heat or cold intolerance, bowel bladder incontinence, focal motor weakness, paresthesia, depression.  She does not have ice pica.    Past Medical History  Diagnosis Date  . Iron deficiency anemia   . Hemorrhoid     No past surgical history on file.  Current Outpatient Prescriptions  Medication Sig  Dispense Refill  . fluticasone (FLONASE) 50 MCG/ACT nasal spray Place 1 spray into the nose daily. 1 spray each nostril daily.      Marland Kitchen loratadine (CLARITIN) 10 MG tablet Take 10 mg by mouth daily.      Marland Kitchen NU-IRON 150 MG capsule TAKE ONE CAPSULE BY MOUTH EVERY DAY  30 capsule  2   No current facility-administered medications for this visit.    ALLERGIES:  is allergic to penicillins.  REVIEW OF SYSTEMS:  The rest of the 14-point review of system was negative.   Filed Vitals:   09/27/12 0906  BP: 101/64  Pulse: 70  Temp: 97.3 F (36.3 C)  Resp: 18   Wt Readings from Last 3 Encounters:  09/27/12 202 lb 12.8 oz (91.989 kg)  04/25/12 195 lb (88.451 kg)  10/22/11 198 lb 9.6 oz (90.084 kg)   ECOG Performance status: 0-1  PHYSICAL EXAMINATION:   General:  well-nourished man, in no acute distress.  Eyes:  no scleral icterus.  ENT:  There were no oropharyngeal lesions.  Neck was without thyromegaly.  Lymphatics:  Negative cervical, supraclavicular or axillary adenopathy.  Respiratory: lungs were clear bilaterally without wheezing or crackles.  Cardiovascular:  Regular rate and rhythm, S1/S2, without murmur, rub or gallop.  There was no pedal edema.  GI:  abdomen was soft, flat, nontender, nondistended, without organomegaly.  Muscoloskeletal:  no spinal tenderness of palpation of vertebral spine.  There was no tenderness to percussion of right flank. There was no palpable mass.   Skin exam was without echymosis, petichae.  Neuro exam was nonfocal.  Patient  was able to get on and off exam table without assistance.  Gait was normal.  Patient was alerted and oriented.  Attention was good.   Language was appropriate.  Mood was normal without depression.  Speech was not pressured.  Thought content was not tangential.     LABORATORY/RADIOLOGY DATA:  Lab Results  Component Value Date   WBC 4.1 09/21/2012   HGB 11.1* 09/21/2012   HCT 33.4* 09/21/2012   PLT 199 09/21/2012   GLUCOSE 88 09/21/2012   ALKPHOS 75  06/17/2010   ALT 30 06/17/2010   AST 24 06/17/2010   NA 138 09/21/2012   K 4.0 09/21/2012   CL 104 09/21/2012   CREATININE 1.0 09/21/2012   BUN 14.4 09/21/2012   CO2 25 09/21/2012    ASSESSMENT AND PLAN:    1.  Iron deficiency anemia:  - Potential causes:  Chronic hemorrhoidal bleed vs. Malabsorptoin.  - Since there is anemia and Iron deficiency. I recommend IV iron in addition to continuing oral iron.  Increase oral iron to twice daily along with OJ or Vit C to improve absorption.   I advised patient to follow up with Dr. [name] to see when he is due to have follow up surveillance colonoscopy since patient thought there was colonic polyps in 2010.  2.  Right flank pain:  Since symptoms has been progressing and persistent, I ordered CT scan abdomen.   3.  Follow up:  Blood check in 4 and 8 months to ensure improving hemoglobin and iron store.  Return visit in about 1 year.

## 2012-09-28 ENCOUNTER — Telehealth: Payer: Self-pay

## 2012-09-28 NOTE — Telephone Encounter (Signed)
I will schedule him for an OV b/c of iron deficiency anemia. Thanks, Rob

## 2012-09-28 NOTE — Telephone Encounter (Signed)
Pt scheduled to see Dr. [name] 10/02/12@3pm . Pt aware of appt date and time.

## 2012-10-02 ENCOUNTER — Other Ambulatory Visit (INDEPENDENT_AMBULATORY_CARE_PROVIDER_SITE_OTHER): Payer: Federal, State, Local not specified - PPO

## 2012-10-02 ENCOUNTER — Encounter: Payer: Self-pay | Admitting: Gastroenterology

## 2012-10-02 ENCOUNTER — Ambulatory Visit (INDEPENDENT_AMBULATORY_CARE_PROVIDER_SITE_OTHER): Payer: Federal, State, Local not specified - PPO | Admitting: Gastroenterology

## 2012-10-02 VITALS — BP 90/60 | HR 75 | Ht 72.0 in | Wt 200.0 lb

## 2012-10-02 DIAGNOSIS — K644 Residual hemorrhoidal skin tags: Secondary | ICD-10-CM

## 2012-10-02 DIAGNOSIS — D509 Iron deficiency anemia, unspecified: Secondary | ICD-10-CM

## 2012-10-02 DIAGNOSIS — R1031 Right lower quadrant pain: Secondary | ICD-10-CM

## 2012-10-02 LAB — COMPREHENSIVE METABOLIC PANEL
ALT: 39 U/L (ref 0–53)
AST: 36 U/L (ref 0–37)
CO2: 26 mEq/L (ref 19–32)
Calcium: 8.9 mg/dL (ref 8.4–10.5)
Chloride: 104 mEq/L (ref 96–112)
GFR: 70.66 mL/min (ref >60.00)
Sodium: 137 mEq/L (ref 135–145)
Total Protein: 6.9 g/dL (ref 6.0–8.3)

## 2012-10-02 NOTE — Assessment & Plan Note (Signed)
Patient continues to have bleeding, presumably from external hemorrhoids. Although not a frequent cause for anemia, persistent bleeding may be contributing.  Recommendations #1 surgical referral for consideration of hemorrhoidectomy

## 2012-10-02 NOTE — Progress Notes (Signed)
History of Present Illness: 52 year old white male with several year history of iron deficiency anemia referred by Dr. Gaylyn Rong for reevaluation.  He has undergone extensive workup in the past including upper and lower endoscopy, small bowel enteroscopy, capsule endoscopy and CT enterography all which were negative. He underwent band ligation of internal hemorrhoids in 2010 because of rectal bleeding. Bleeding significantly improved but has gradually worsened. He continues to have almost daily painless rectal bleeding. Celiac panel was requested in the past but was not done.  He has undergone iron infusions because he probably does not probably absorb iron. He is on no gastric irritants including nonsteroidals although has taking these in the past. He has no other specific GI complaints except for bleeding.    Past Medical History  Diagnosis Date  . Iron deficiency anemia   . Hemorrhoid    History reviewed. No pertinent past surgical history. family history is not on file. Current Outpatient Prescriptions  Medication Sig Dispense Refill  . fluticasone (FLONASE) 50 MCG/ACT nasal spray Place 1 spray into the nose daily. 1 spray each nostril daily.      Marland Kitchen loratadine (CLARITIN) 10 MG tablet Take 10 mg by mouth daily.      Marland Kitchen NU-IRON 150 MG capsule TAKE ONE CAPSULE BY MOUTH EVERY DAY  30 capsule  2   No current facility-administered medications for this visit.   Allergies as of 10/02/2012 - Review Complete 10/02/2012  Allergen Reaction Noted  . Penicillins      reports that he has never smoked. He does not have any smokeless tobacco history on file. His alcohol and drug histories are not on file.     Review of Systems: Pertinent positive and negative review of systems were noted in the above HPI section. All other review of systems were otherwise negative.  Vital signs were reviewed in today's medical record Physical Exam: General: Well developed , well nourished, no acute distress Skin:  anicteric Head: Normocephalic and atraumatic Eyes:  sclerae anicteric, EOMI Ears: Normal auditory acuity Mouth: No deformity or lesions Neck: Supple, no masses or thyromegaly Lungs: Clear throughout to auscultation Heart: Regular rate and rhythm; no murmurs, rubs or bruits Abdomen: Soft, non tender and non distended. No masses, hepatosplenomegaly or hernias noted. Normal Bowel sounds Rectal: There are grade 4 hemorrhoids Musculoskeletal: Symmetrical with no gross deformities  Skin: No lesions on visible extremities Pulses:  Normal pulses noted Extremities: No clubbing, cyanosis, edema or deformities noted Neurological: Alert oriented x 4, grossly nonfocal Cervical Nodes:  No significant cervical adenopathy Inguinal Nodes: No significant inguinal adenopathy Psychological:  Alert and cooperative. Normal mood and affect

## 2012-10-02 NOTE — Assessment & Plan Note (Addendum)
Etiology for iron deficiency has never been established with certainty. Anemia from hemorrhoidal bleeding is somewhat unusual. Malabsorption has been considered although I don't see any specific testing for this. Occult bleeding from AVMs may be operative. Bleeding from a neoplasm is very unlikely.  Recommendations #1 check celiac panel #2 refer to surgery for consideration of hemorrhoidectomy  I will defer endoscopic studies since there is no history of polyps or neoplasm.

## 2012-10-02 NOTE — Patient Instructions (Addendum)
.  Go to the basement for labs today  You have been scheduled for an appointment with Dr Derrell Lolling at [facility]. Your appointment is on 10/13/2012 at 3:45. Please arrive at 3:30pm for registration. Make certain to bring a list of current medications, including any over the counter medications or vitamins. Also bring your co-pay if you have one as well as your insurance cards. [facility] is located at [address], [address]. Should you need to reschedule your appointment, please contact them at [phone].

## 2012-10-05 ENCOUNTER — Encounter (HOSPITAL_COMMUNITY): Payer: Self-pay

## 2012-10-05 ENCOUNTER — Other Ambulatory Visit: Payer: Self-pay | Admitting: *Deleted

## 2012-10-05 ENCOUNTER — Encounter: Payer: Self-pay | Admitting: Oncology

## 2012-10-05 ENCOUNTER — Ambulatory Visit (HOSPITAL_COMMUNITY)
Admission: RE | Admit: 2012-10-05 | Discharge: 2012-10-05 | Disposition: A | Discharge status: Home / Self Care | Payer: Federal, State, Local not specified - PPO | Source: Ambulatory Visit | Attending: Oncology | Admitting: Oncology

## 2012-10-05 DIAGNOSIS — R109 Unspecified abdominal pain: Secondary | ICD-10-CM

## 2012-10-05 LAB — CAROTENE, SERUM: Carotene, Total-Serum: 17 ug/dL (ref 4–51)

## 2012-10-05 MED ORDER — IOHEXOL 300 MG/ML  SOLN
100.0000 mL | Freq: Once | INTRAMUSCULAR | Status: AC | PRN
  Administered 2012-10-05: 100 mL via INTRAVENOUS

## 2012-10-09 ENCOUNTER — Other Ambulatory Visit: Payer: Federal, State, Local not specified - PPO

## 2012-10-09 DIAGNOSIS — R109 Unspecified abdominal pain: Secondary | ICD-10-CM

## 2012-10-10 LAB — RETICULIN ANTIBODIES, IGA W TITER: Reticulin Ab, IgA: NEGATIVE

## 2012-10-10 LAB — GLIADIN ANTIBODIES, SERUM
Gliadin IgA: 6.2 U/mL (ref <20)
Gliadin IgG: 8.5 U/mL (ref <20)

## 2012-10-12 NOTE — Progress Notes (Signed)
Quick Note:  Please inform the patient that lab work was normal and to continue current plan of action ______ 

## 2012-10-13 ENCOUNTER — Ambulatory Visit (INDEPENDENT_AMBULATORY_CARE_PROVIDER_SITE_OTHER): Payer: Federal, State, Local not specified - PPO | Admitting: General Surgery

## 2012-10-16 ENCOUNTER — Ambulatory Visit (INDEPENDENT_AMBULATORY_CARE_PROVIDER_SITE_OTHER): Payer: Federal, State, Local not specified - PPO | Admitting: Surgery

## 2012-10-16 ENCOUNTER — Other Ambulatory Visit: Payer: Self-pay | Admitting: Oncology

## 2012-10-16 ENCOUNTER — Encounter (INDEPENDENT_AMBULATORY_CARE_PROVIDER_SITE_OTHER): Payer: Self-pay | Admitting: Surgery

## 2012-10-16 VITALS — BP 112/74 | HR 72 | Temp 97.4°F | Resp 20 | Ht 69.0 in | Wt 200.0 lb

## 2012-10-16 DIAGNOSIS — K648 Other hemorrhoids: Secondary | ICD-10-CM

## 2012-10-16 DIAGNOSIS — K644 Residual hemorrhoidal skin tags: Secondary | ICD-10-CM

## 2012-10-16 NOTE — Progress Notes (Signed)
Patient ID: Andrew Villegas, male   DOB: March 18, 1961, 52 y.o.   MRN: [id number]  Chief Complaint  Patient presents with  . Other    hemorrhoids    HPI Andrew Villegas is a 52 y.o. male.   HPIThis is a very pleasant gentleman referred by Dr. Melvia Heaps for evaluation of bleeding hemorrhoids. This has been going on for many years. He has a significant history of anemia and has had a thorough workup including upper lower endoscopies and evaluation of the small bowel. He had injection of his internal hemorrhoids in 2010 which did help with the bleeding for several months. He currently has bleeding with every bowel movement. He has some discomfort. He has no issues with continence  Past Medical History  Diagnosis Date  . Iron deficiency anemia   . Hemorrhoid     Past Surgical History  Procedure Laterality Date  . Tonsillectomy    . Rotator cuff repair    . Hemorrhoid surgery      Family History  Problem Relation Age of Onset  . Cancer Mother     brain tumors  . Alzheimer's disease Father     Social History History  Substance Use Topics  . Smoking status: Never Smoker   . Smokeless tobacco: Not on file  . Alcohol Use: 0.6 oz/week    1 Cans of beer per week    Allergies  Allergen Reactions  . Penicillins     Current Outpatient Prescriptions  Medication Sig Dispense Refill  . fluticasone (FLONASE) 50 MCG/ACT nasal spray Place 1 spray into the nose daily. 1 spray each nostril daily.      Marland Kitchen ibuprofen (ADVIL,MOTRIN) 100 MG chewable tablet Chew 100 mg by mouth every 8 (eight) hours as needed for fever.      . loratadine (CLARITIN) 10 MG tablet Take 10 mg by mouth daily.      Marland Kitchen NU-IRON 150 MG capsule TAKE ONE CAPSULE BY MOUTH EVERY DAY  30 capsule  2   No current facility-administered medications for this visit.    Review of Systems Review of Systems  Constitutional: Negative for fever, chills and unexpected weight change.  HENT: Negative for hearing loss, congestion, sore  throat, trouble swallowing and voice change.   Eyes: Negative for visual disturbance.  Respiratory: Negative for cough and wheezing.   Cardiovascular: Negative for chest pain, palpitations and leg swelling.  Gastrointestinal: Positive for blood in stool and anal bleeding. Negative for nausea, vomiting, abdominal pain, diarrhea, constipation, abdominal distention and rectal pain.  Genitourinary: Negative for hematuria and difficulty urinating.  Musculoskeletal: Negative for arthralgias.  Skin: Negative for rash and wound.  Neurological: Negative for seizures, syncope, weakness and headaches.  Hematological: Negative for adenopathy. Does not bruise/bleed easily.  Psychiatric/Behavioral: Negative for confusion.    Blood pressure 112/74, pulse 72, temperature 97.4 F (36.3 C), temperature source Temporal, resp. rate 20, height 5\' 9"  (1.753 m), weight 200 lb (90.719 kg).  Physical Exam Physical Exam  Constitutional: He is oriented to person, place, and time. He appears well-developed and well-nourished. No distress.  HENT:  Head: Normocephalic and atraumatic.  Right Ear: External ear normal.  Left Ear: External ear normal.  Nose: Nose normal.  Mouth/Throat: Oropharynx is clear and moist. No oropharyngeal exudate.  Eyes: Conjunctivae are normal. Pupils are equal, round, and reactive to light. Right eye exhibits no discharge. Left eye exhibits no discharge. No scleral icterus.  Neck: Normal range of motion. Neck supple. No tracheal deviation present.  No thyromegaly present.  Cardiovascular: Normal rate, regular rhythm, normal heart sounds and intact distal pulses.   No murmur heard. Pulmonary/Chest: Effort normal and breath sounds normal. No respiratory distress. He has no wheezes. He has no rales.  Abdominal: Soft. Bowel sounds are normal. He exhibits no distension. There is no tenderness. There is no rebound.  Genitourinary:  Rectal tone is normal. He has large internal and external  hemorrhoids. panendoscopy was performed confirming this  Musculoskeletal: Normal range of motion. He exhibits no edema and no tenderness.  Lymphadenopathy:    He has no cervical adenopathy.  Neurological: He is alert and oriented to person, place, and time.  Skin: Skin is warm and dry. No rash noted. He is not diaphoretic. No erythema.  Psychiatric: His behavior is normal. Judgment normal.    Data Reviewed   Assessment    Bleeding internal and external hemorrhoids     Plan    As he has failed conservative management, operative hemorrhoidectomy is recommended. I discussed this with him in detail. I discussed the risks of surgery which includes but is not limited to bleeding, infection, recurrence, continence issues, etc. He understands and wishes to proceed. Surgery will be scheduled.        Danashia Landers A 10/16/2012, 1:49 PM

## 2012-11-13 ENCOUNTER — Encounter (HOSPITAL_COMMUNITY): Payer: Self-pay | Admitting: Pharmacy Technician

## 2012-11-17 ENCOUNTER — Other Ambulatory Visit: Payer: Self-pay

## 2012-11-17 ENCOUNTER — Encounter (HOSPITAL_COMMUNITY): Payer: Self-pay

## 2012-11-17 ENCOUNTER — Telehealth (INDEPENDENT_AMBULATORY_CARE_PROVIDER_SITE_OTHER): Payer: Self-pay | Admitting: *Deleted

## 2012-11-17 ENCOUNTER — Encounter (HOSPITAL_COMMUNITY)
Admission: RE | Admit: 2012-11-17 | Discharge: 2012-11-17 | Disposition: A | Discharge status: Home / Self Care | Payer: Federal, State, Local not specified - PPO | Source: Ambulatory Visit | Attending: Surgery | Admitting: Surgery

## 2012-11-17 ENCOUNTER — Emergency Department (HOSPITAL_COMMUNITY)
Admission: EM | Admit: 2012-11-17 | Discharge: 2012-11-18 | Disposition: A | Discharge status: Home / Self Care | Payer: Federal, State, Local not specified - PPO | Attending: Emergency Medicine | Admitting: Emergency Medicine

## 2012-11-17 DIAGNOSIS — D649 Anemia, unspecified: Secondary | ICD-10-CM

## 2012-11-17 DIAGNOSIS — R0602 Shortness of breath: Secondary | ICD-10-CM | POA: Insufficient documentation

## 2012-11-17 DIAGNOSIS — Z79899 Other long term (current) drug therapy: Secondary | ICD-10-CM | POA: Insufficient documentation

## 2012-11-17 DIAGNOSIS — Z880 Allergy status to penicillin: Secondary | ICD-10-CM | POA: Insufficient documentation

## 2012-11-17 DIAGNOSIS — K625 Hemorrhage of anus and rectum: Secondary | ICD-10-CM | POA: Insufficient documentation

## 2012-11-17 DIAGNOSIS — Z8739 Personal history of other diseases of the musculoskeletal system and connective tissue: Secondary | ICD-10-CM | POA: Insufficient documentation

## 2012-11-17 DIAGNOSIS — Z8679 Personal history of other diseases of the circulatory system: Secondary | ICD-10-CM | POA: Insufficient documentation

## 2012-11-17 DIAGNOSIS — R42 Dizziness and giddiness: Secondary | ICD-10-CM | POA: Insufficient documentation

## 2012-11-17 HISTORY — DX: Chest pain, unspecified: R07.9

## 2012-11-17 HISTORY — DX: Unspecified osteoarthritis, unspecified site: M19.90

## 2012-11-17 HISTORY — DX: Sleep apnea, unspecified: G47.30

## 2012-11-17 HISTORY — DX: Shortness of breath: R06.02

## 2012-11-17 LAB — ABO/RH: ABO/RH(D): O NEG

## 2012-11-17 LAB — BASIC METABOLIC PANEL
BUN: 11 mg/dL (ref 6–23)
Calcium: 8.9 mg/dL (ref 8.4–10.5)
Chloride: 105 mEq/L (ref 96–112)
Creatinine, Ser: 0.98 mg/dL (ref 0.50–1.35)
GFR calc Af Amer: >90 mL/min (ref >90)

## 2012-11-17 LAB — CBC
Hemoglobin: 6.9 g/dL — CL (ref 13.0–17.0)
MCH: 24.1 pg — ABNORMAL LOW (ref 26.0–34.0)
MCHC: 30.4 g/dL (ref 30.0–36.0)

## 2012-11-17 LAB — PREPARE RBC (CROSSMATCH)

## 2012-11-17 LAB — SURGICAL PCR SCREEN: MRSA, PCR: NEGATIVE

## 2012-11-17 MED ORDER — DIPHENHYDRAMINE HCL 50 MG/ML IJ SOLN
25.0000 mg | Freq: Once | INTRAMUSCULAR | Status: AC
  Administered 2012-11-17: 25 mg via INTRAVENOUS
  Filled 2012-11-17: qty 1

## 2012-11-17 NOTE — ED Provider Notes (Signed)
History     CSN: [id number]  Arrival date & time 11/17/12  1614   First MD Initiated Contact with Patient 11/17/12 1618      Chief Complaint  Patient presents with  . Anemia    (Consider location/radiation/quality/duration/timing/severity/associated sxs/prior treatment) HPI Comments: Patient with history of iron deficiency anemia secondary to bleeding hemorrhoids and possible malabsorption of iron -- presents after having abnormal hemoglobin today. Hemoglobin was 6.9. Patient endorses increasing fatigue, shortness of breath with activity over the past several days. He states that his hemorrhoidal bleeding has been worse than usual. He denies hemoptysis, hematemesis, hematuria, bruising. No fever, nausea, vomiting, abdominal pain, urinary symptoms. Onset of symptoms gradual. Course gradually worsening. Nothing makes symptoms better or worse.  The history is provided by the patient and medical records.    Past Medical History  Diagnosis Date  . Iron deficiency anemia   . Hemorrhoid     BLEEDING FROM HEMORRHOIDS FOR YRS  . Chest pain     HAS CHEST PAINS THAT PT ATTRIBUTES TO HIS ANEMIA --STATES PAST HX OF ABNORMAL EKGS BUT NEGATIVE STRESS TESTS.  Marland Kitchen Shortness of breath     ATTRIBUTES TO THE POLLEN AND TO HIS ANEMIA  . Arthritis     ALL JOINTS HURT  . Sleep apnea     PT STATES TOLD MILD - DID NOT NEED CPAP    Past Surgical History  Procedure Laterality Date  . Tonsillectomy    . Rotator cuff repair Left 2002 OR 2003    STILL HAS SOME PAIN IN SHOULDER  . Rectal polyps removed      Family History  Problem Relation Age of Onset  . Cancer Mother     brain tumors  . Alzheimer's disease Father     History  Substance Use Topics  . Smoking status: Never Smoker   . Smokeless tobacco: Never Used  . Alcohol Use: 0.6 oz/week    1 Cans of beer per week     Comment: every 6 months      Review of Systems  Constitutional: Negative for fever.  HENT: Negative for sore throat  and rhinorrhea.   Eyes: Negative for redness.  Respiratory: Positive for shortness of breath. Negative for cough.   Cardiovascular: Negative for chest pain and leg swelling.  Gastrointestinal: Positive for anal bleeding. Negative for nausea, vomiting, abdominal pain, diarrhea and blood in stool.  Genitourinary: Negative for dysuria.  Musculoskeletal: Negative for myalgias.  Skin: Negative for rash.  Neurological: Positive for light-headedness. Negative for headaches.    Allergies  Penicillins  Home Medications   Current Outpatient Rx  Name  Route  Sig  Dispense  Refill  . acetaminophen (TYLENOL) 500 MG tablet   Oral   Take 500 mg by mouth every 6 (six) hours as needed for pain.         . fluticasone (FLONASE) 50 MCG/ACT nasal spray   Nasal   Place 1 spray into the nose daily.          . iron polysaccharides (NU-IRON) 150 MG capsule   Oral   Take 150 mg by mouth 2 (two) times daily.         Marland Kitchen loratadine (CLARITIN) 10 MG tablet   Oral   Take 10 mg by mouth daily.         . magnesium hydroxide (MILK OF MAGNESIA) 400 MG/5ML suspension   Oral   Take 15 mLs by mouth daily as needed for constipation or heartburn.  BP 124/62  Pulse 92  Temp(Src) 98.8 F (37.1 C) (Oral)  Resp 24  SpO2 100%  Physical Exam  Nursing note and vitals reviewed. Constitutional: He appears well-developed and well-nourished.  HENT:  Head: Normocephalic and atraumatic.  Eyes: Pupils are equal, round, and reactive to light. Right eye exhibits no discharge. Left eye exhibits no discharge. Right conjunctiva is not injected. Left conjunctiva is not injected.  Conjunctiva pale.  Neck: Normal range of motion. Neck supple.  Cardiovascular: Normal rate, regular rhythm and normal heart sounds.   Pulmonary/Chest: Effort normal and breath sounds normal. No respiratory distress. He has no wheezes.  Abdominal: Soft. There is no tenderness.  Genitourinary: Rectal exam shows external  hemorrhoid. Rectal exam shows no internal hemorrhoid, no fissure, no mass, no tenderness and anal tone normal. Guaiac negative stool.  Neurological: He is alert.  Skin: Skin is warm and dry. There is pallor.  Psychiatric: He has a normal mood and affect.    ED Course  Procedures (including critical care time)  Labs Reviewed  BASIC METABOLIC PANEL  OCCULT BLOOD X 1 CARD TO LAB, STOOL  OCCULT BLOOD, POC DEVICE  TYPE AND SCREEN  PREPARE RBC (CROSSMATCH)  ABO/RH   No results found.   1. Anemia     5:06 PM Patient seen and examined. Work-up initiated.   Vital signs reviewed and are as follows: Filed Vitals:   11/17/12 1630  BP: 124/62  Pulse: 92  Temp: 98.8 F (37.1 C)  Resp: 24   5:17 PM D/w Dr. Ethelda Chick who will see. Will transfuse. Pt does not want to be admitted. Will transfuse in ED and likely d/c to home.   6:45 PM Dr. Myna Hidalgo notified that patient is in ED.   8:03 PM Blood transfusion about to be started. Handoff to [facility] who will monitor and d/c when transfusion complete.    MDM  Symptomatic and clinical anemia, refuses admission. Pt has hematologist. Transfused in ED.         Renne Crigler, PA-C 11/17/12 2010

## 2012-11-17 NOTE — Telephone Encounter (Signed)
Received a call from Pat at pre-op who reported patient's Hgb was 6.9.  Informed Magnus Ivan MD of lab results.  Called and spoke with patient who states he has been weak lately but didn't think anything of it.  Patient states he will go on to the ED this evening to be assessed for the Hgb 6.9.

## 2012-11-17 NOTE — Pre-Procedure Instructions (Signed)
HX OF ANEMIA - PT HAS PALE APPEARANCE.  CBC AND EKG WERE DONE TODAY - PREOP.  PT'S VITAL SIGNS 122/64, P 87 R 16 OXYGEN SAT 100%. LAB CALLED PT'S HGB OF 6.9 - CRITICAL LAB VALUE.  KRISTEN AT [facility] NOTIFIED OF CRITICAL LAB HGB 6.9 - KRISTIN NOTIFIED DR. BLACKMAN AND THEN KRISTEN NOTIFIED PT  THAT DR. BLACKMAN WANTED HIM TO GO TO THE ER AND PT VOICED UNDERSTANDING.

## 2012-11-17 NOTE — ED Provider Notes (Signed)
Medical screening examination/treatment/procedure(s) were conducted as a shared visit with non-physician practitioner(s) and myself.  I personally evaluated the patient during the encounter  Doug Sou, MD 11/17/12 2303

## 2012-11-17 NOTE — Patient Instructions (Signed)
YOUR SURGERY IS SCHEDULED AT [facility]  ON:   Monday  5/12  REPORT TO [place] [facility]AY [facility] AT:  5:30 AM      PHONE # FOR SHORT STAY IS [phone]   DO NOT EAT OR DRINK ANYTHING AFTER MIDNIGHT THE NIGHT BEFORE YOUR SURGERY.  YOU MAY BRUSH YOUR TEETH, RINSE OUT YOUR MOUTH--BUT NO WATER, NO FOOD, NO CHEWING GUM, NO MINTS, NO CANDIES, NO CHEWING TOBACCO.  PLEASE TAKE THE FOLLOWING MEDICATIONS THE AM OF YOUR SURGERY WITH A FEW SIPS OF WATER:  CLARITIN.  MAY USE YOUR FLONASE.    DO NOT BRING VALUABLES, MONEY, CREDIT CARDS.  DO NOT WEAR JEWELRY, MAKE-UP, NAIL POLISH AND NO METAL PINS OR CLIPS IN YOUR HAIR. CONTACT LENS, DENTURES / PARTIALS, GLASSES SHOULD NOT BE WORN TO SURGERY AND IN MOST CASES-HEARING AIDS WILL NEED TO BE REMOVED.  BRING YOUR GLASSES CASE, ANY EQUIPMENT NEEDED FOR YOUR CONTACT LENS. FOR PATIENTS ADMITTED TO THE HOSPITAL--CHECK OUT TIME THE DAY OF DISCHARGE IS 11:00 AM.  ALL INPATIENT ROOMS ARE PRIVATE - WITH BATHROOM, TELEPHONE, TELEVISION AND WIFI INTERNET.  IF YOU ARE BEING DISCHARGED THE SAME DAY OF YOUR SURGERY--YOU CAN NOT DRIVE YOURSELF HOME--AND SHOULD NOT GO HOME ALONE BY TAXI OR BUS.  NO DRIVING OR OPERATING MACHINERY FOR 24 HOURS FOLLOWING ANESTHESIA / PAIN MEDICATIONS.  PLEASE MAKE ARRANGEMENTS FOR SOMEONE TO BE WITH YOU AT HOME THE FIRST 24 HOURS AFTER SURGERY. RESPONSIBLE DRIVER'S NAME DOROTHY Strubel  - PT'S WIFE                                               PHONE #   [phone]                               PLEASE READ OVER ANY  FACT SHEETS THAT YOU WERE GIVEN: MRSA INFORMATION, BLOOD TRANSFUSION INFORMATION, INCENTIVE SPIROMETER INFORMATION. FAILURE TO FOLLOW THESE INSTRUCTIONS MAY RESULT IN THE CANCELLATION OF YOUR SURGERY.   PATIENT SIGNATURE_________________________________

## 2012-11-17 NOTE — ED Notes (Signed)
Patient is to have hemorrhoid surgery 6/12. PAtient received a call from preop concerning his hmg=6.8. Patient is extremely pale. Arrived in the ER pushing his friend who is in wheelchair.

## 2012-11-17 NOTE — ED Provider Notes (Signed)
Complains of generalized weakness for the past several weeks. Has had similar episodes in the past when his hemoglobin has become low. He denies shortness of breath denies chest pain denies other complaint. Patient is not acutely ill appearing speaks in paragraphs no distress Patient noted to be anemic hospitalization offer for transfusion which he declines.  Date: 11/17/2012  Rate: 90  Rhythm: normal sinus rhythm  QRS Axis: normal  Intervals: normal  ST/T Wave abnormalities: normal  Conduction Disutrbances: none  Narrative Interpretation: unremarkable    ;  Doug Sou, MD 11/17/12 2302

## 2012-11-18 LAB — TYPE AND SCREEN
ABO/RH(D): O NEG
Unit division: 0

## 2012-11-18 NOTE — ED Provider Notes (Signed)
Patient care assumed from [facility], PA-C at shift change. Patient has received 2 units of RBCs for anemia. Patient tolerated well and has remained afebrile. On my examination patient is well and nontoxic appearing, pallor improved. Heart RRR, lungs CTAB, abdomen without tenderness to palpation; hemodynamically stable. No pain, fever, hypotension, SOB or hypoxia to suggest transfusion reaction. Patient appropriate for discharge with primary care follow up. Indications for ED return provided. Patient states comfort and understanding with this discharge plan and no unaddressed concerns.   Filed Vitals:   11/18/12 0113 11/18/12 0130 11/18/12 0200 11/18/12 0206  BP:  107/56 107/64 115/64  Pulse:  80 82   Temp: 98.8 F (37.1 C)   98.2 F (36.8 C)  TempSrc:    Oral  Resp:  14 16 15   SpO2:  100% 100% 100%     Antony Madura, PA-C 11/18/12 0216

## 2012-11-18 NOTE — ED Provider Notes (Signed)
Medical screening examination/treatment/procedure(s) were conducted as a shared visit with non-physician practitioner(s) and myself.  I personally evaluated the patient during the encounter  Andrew Sakurai, MD 11/18/12 1021 

## 2012-11-18 NOTE — ED Notes (Signed)
1st unit PRBC transfusion , Unit #[id number] stopped , no transfusion reaction noted.

## 2012-11-18 NOTE — ED Notes (Signed)
2nd Unit PRBC transfusion completed without transfusion reaction noted.

## 2012-11-20 ENCOUNTER — Telehealth (INDEPENDENT_AMBULATORY_CARE_PROVIDER_SITE_OTHER): Payer: Self-pay

## 2012-11-20 ENCOUNTER — Telehealth: Payer: Self-pay | Admitting: Oncology

## 2012-11-20 ENCOUNTER — Other Ambulatory Visit: Payer: Self-pay | Admitting: Oncology

## 2012-11-20 DIAGNOSIS — D509 Iron deficiency anemia, unspecified: Secondary | ICD-10-CM

## 2012-11-20 NOTE — Telephone Encounter (Signed)
Patient calling to make [place] aware that he was seen in ED on Friday due to low hemoglobin.  Patient states he rec'd blood Friday.  Patient scheduled for surgery on 11/27/12 and wanted to make sure that he's cleared to proceed with surgery.

## 2012-11-20 NOTE — Telephone Encounter (Signed)
Reviewed recent patient's hemoglobin results with Dr. Magnus Ivan.  Called patient to make aware that he's okay to proceed with surgery.

## 2012-11-21 ENCOUNTER — Telehealth: Payer: Self-pay | Admitting: Family Medicine

## 2012-11-21 ENCOUNTER — Telehealth: Payer: Self-pay | Admitting: Oncology

## 2012-11-21 NOTE — Telephone Encounter (Signed)
Patient given 2 unites of blood at [facility] ED on 11/17/12.  Has a long history of anemia. He is scheduled for a hemorrhoidectomy on 11/27/12 and has been cleared for surgery even with anemia.   Patient is requesting a hospital f/u and CBC to see if his hemoglobin has improved.  Reports feeling somewhat better but still fatigued.   Appt scheduled for 11/23/12 with Ruthell Rummage, FNP.

## 2012-11-23 ENCOUNTER — Ambulatory Visit (INDEPENDENT_AMBULATORY_CARE_PROVIDER_SITE_OTHER): Payer: Federal, State, Local not specified - PPO | Admitting: General Practice

## 2012-11-23 ENCOUNTER — Encounter: Payer: Self-pay | Admitting: General Practice

## 2012-11-23 VITALS — BP 106/67 | HR 58 | Temp 97.2°F | Ht 71.0 in | Wt 204.0 lb

## 2012-11-23 DIAGNOSIS — D649 Anemia, unspecified: Secondary | ICD-10-CM

## 2012-11-23 LAB — POCT CBC
Granulocyte percent: 56.9 %G (ref 37–80)
HCT, POC: 31.4 % — AB (ref 43.5–53.7)
Lymph, poc: 1.4 (ref 0.6–3.4)
MCHC: 30.7 g/dL — AB (ref 31.8–35.4)
MCV: 77.9 fL — AB (ref 80–97)
POC Granulocyte: 2.3 (ref 2–6.9)
Platelet Count, POC: 225 10*3/uL (ref 142–424)
RDW, POC: 17.5 %
WBC: 4 10*3/uL — AB (ref 4.6–10.2)

## 2012-11-23 LAB — ANEMIA PANEL 7
%SAT: 9 % — ABNORMAL LOW (ref 20–55)
Ferritin: 5 ng/mL — ABNORMAL LOW (ref 22–322)
Folate: 11 ng/mL
MCH: 24.6 pg — ABNORMAL LOW (ref 26.0–34.0)
MCV: 78.2 fL (ref 78.0–100.0)
Platelets: 226 10*3/uL (ref 150–400)
RDW: 17.5 % — ABNORMAL HIGH (ref 11.5–15.5)
Retic Ct Pct: 2.1 % (ref 0.4–2.3)
Vitamin B-12: 326 pg/mL (ref 211–911)

## 2012-11-23 MED ORDER — FLUTICASONE PROPIONATE 50 MCG/ACT NA SUSP: 1.0000 | Freq: Every day | NASAL | Status: AC

## 2012-11-23 NOTE — Progress Notes (Signed)
  Subjective:    Patient ID: Andrew Villegas, male    DOB: 1960-09-09, 52 y.o.   MRN: [id number]  HPI Presents today for follow up of hospital visit, on 11/17/12. Hgb was 6.9. Report receiving 2 units of PRBCs and discharge to home. Reports going in for surgery on Monday 11/27/12, hemmoroidectomy.     Review of Systems  Constitutional: Negative for fever and chills.  Respiratory: Negative for chest tightness and shortness of breath.   Cardiovascular: Negative for chest pain.  Genitourinary: Negative for difficulty urinating.  Skin: Positive for pallor.  Neurological: Negative for dizziness and headaches.  Psychiatric/Behavioral: Negative.        Objective:   Physical Exam  Constitutional: He is oriented to person, place, and time. He appears well-developed and well-nourished.  Cardiovascular: Normal rate, regular rhythm and normal heart sounds.   No murmur heard. Pulmonary/Chest: Effort normal and breath sounds normal. No respiratory distress. He exhibits no tenderness.  Abdominal: Soft. Bowel sounds are normal. He exhibits no distension. There is no tenderness. There is no rebound.  Neurological: He is alert and oriented to person, place, and time.  Skin: Skin is warm and dry. There is pallor.  Psychiatric: He has a normal mood and affect.          Assessment & Plan:  Labs pending Continue current medications RTO or Go to nearest ER if symptoms worsen Patient verbalized understanding Coralie Keens, FNP-C

## 2012-11-23 NOTE — Patient Instructions (Signed)
Anemia, Nonspecific  Your exam and blood tests show you are anemic. This means your blood (hemoglobin) level is low. Normal hemoglobin values are 12 to 15 g/dL for females and 14 to 17 g/dL for males. Make a note of your hemoglobin level today. The hematocrit percent is also used to measure anemia. A normal hematocrit is 38% to 46% in females and 42% to 49% in males. Make a note of your hematocrit level today.  CAUSES   Anemia can be due to many different causes.   Excessive bleeding from periods (in women).   Intestinal bleeding.   Poor nutrition.   Kidney, thyroid, liver, and bone marrow diseases.  SYMPTOMS   Anemia can come on suddenly (acute). It can also come on slowly. Symptoms can include:   Minor weakness.   Dizziness.   Palpitations.   Shortness of breath.  Symptoms may be absent until half your hemoglobin is missing if it comes on slowly. Anemia due to acute blood loss from an injury or internal bleeding may require blood transfusion if the loss is severe. Hospital care is needed if you are anemic and there is significant continual blood loss.  TREATMENT    Stool tests for blood (Hemoccult) and additional lab tests are often needed. This determines the best treatment.   Further checking on your condition and your response to treatment is very important. It often takes many weeks to correct anemia.  Depending on the cause, treatment can include:   Supplements of iron.   Vitamins B12 and folic acid.   Hormone medicines.If your anemia is due to bleeding, finding the cause of the blood loss is very important. This will help avoid further problems.  SEEK IMMEDIATE MEDICAL CARE IF:    You develop fainting, extreme weakness, shortness of breath, or chest pain.   You develop heavy vaginal bleeding.   You develop bloody or black, tarry stools or vomit up blood.   You develop a high fever, rash, repeated vomiting, or dehydration.  Document Released: 08/12/2004 Document Revised: 09/27/2011 Document  Reviewed: 05/20/2009  [employer] Patient Information 2013 [employer].

## 2012-11-24 ENCOUNTER — Telehealth: Payer: Self-pay | Admitting: General Practice

## 2012-11-24 NOTE — Telephone Encounter (Signed)
Spoke with patient and informed of lab values. Hgb 9.7. Patient scheduled for surgery on Monday. Patient aware to watch for bleeding and seek medical attention.

## 2012-11-24 NOTE — Pre-Procedure Instructions (Signed)
PER NOTES IN EPIC - PT WAS GIVEN 2 UNITS OF BLOOD IN ER 5/2 AND HAD FOLLOW UP VISIT AT HIS PRIMARY CARE DOCTOR'S OFFICE ON 5/8 - REPEAT CBC AND HGB UP TO 9.7.

## 2012-11-25 NOTE — H&P (Signed)
Patient ID: Andrew Villegas, male DOB: [birth year], 52 y.o. MRN: [id number]  Chief Complaint   Patient presents with   .  Other     hemorrhoids   HPI  Andrew Villegas is a 52 y.o. male.  HPIThis is a very pleasant gentleman referred by Dr. Melvia Heaps for evaluation of bleeding hemorrhoids. This has been going on for many years. He has a significant history of anemia and has had a thorough workup including upper lower endoscopies and evaluation of the small bowel. He had injection of his internal hemorrhoids in 2010 which did help with the bleeding for several months. He currently has bleeding with every bowel movement. He has some discomfort. He has no issues with continence  Past Medical History   Diagnosis  Date   .  Iron deficiency anemia    .  Hemorrhoid     Past Surgical History   Procedure  Laterality  Date   .  Tonsillectomy     .  Rotator cuff repair     .  Hemorrhoid surgery      Family History   Problem  Relation  Age of Onset   .  Cancer  Mother      brain tumors   .  Alzheimer's disease  Father    Social History  History   Substance Use Topics   .  Smoking status:  Never Smoker   .  Smokeless tobacco:  Not on file   .  Alcohol Use:  0.6 oz/week     1 Cans of beer per week    Allergies   Allergen  Reactions   .  Penicillins     Current Outpatient Prescriptions   Medication  Sig  Dispense  Refill   .  fluticasone (FLONASE) 50 MCG/ACT nasal spray  Place 1 spray into the nose daily. 1 spray each nostril daily.     Marland Kitchen  ibuprofen (ADVIL,MOTRIN) 100 MG chewable tablet  Chew 100 mg by mouth every 8 (eight) hours as needed for fever.     .  loratadine (CLARITIN) 10 MG tablet  Take 10 mg by mouth daily.     Marland Kitchen  NU-IRON 150 MG capsule  TAKE ONE CAPSULE BY MOUTH EVERY DAY  30 capsule  2    No current facility-administered medications for this visit.   Review of Systems  Review of Systems  Constitutional: Negative for fever, chills and unexpected weight change.  HENT:  Negative for hearing loss, congestion, sore throat, trouble swallowing and voice change.  Eyes: Negative for visual disturbance.  Respiratory: Negative for cough and wheezing.  Cardiovascular: Negative for chest pain, palpitations and leg swelling.  Gastrointestinal: Positive for blood in stool and anal bleeding. Negative for nausea, vomiting, abdominal pain, diarrhea, constipation, abdominal distention and rectal pain.  Genitourinary: Negative for hematuria and difficulty urinating.  Musculoskeletal: Negative for arthralgias.  Skin: Negative for rash and wound.  Neurological: Negative for seizures, syncope, weakness and headaches.  Hematological: Negative for adenopathy. Does not bruise/bleed easily.  Psychiatric/Behavioral: Negative for confusion.  Blood pressure 112/74, pulse 72, temperature 97.4 F (36.3 C), temperature source Temporal, resp. rate 20, height 5\' 9"  (1.753 m), weight 200 lb (90.719 kg).  Physical Exam  Physical Exam  Constitutional: He is oriented to person, place, and time. He appears well-developed and well-nourished. No distress.  HENT:  Head: Normocephalic and atraumatic.  Right Ear: External ear normal.  Left Ear: External ear normal.  Nose: Nose normal.  Mouth/Throat:  Oropharynx is clear and moist. No oropharyngeal exudate.  Eyes: Conjunctivae are normal. Pupils are equal, round, and reactive to light. Right eye exhibits no discharge. Left eye exhibits no discharge. No scleral icterus.  Neck: Normal range of motion. Neck supple. No tracheal deviation present. No thyromegaly present.  Cardiovascular: Normal rate, regular rhythm, normal heart sounds and intact distal pulses.  No murmur heard.  Pulmonary/Chest: Effort normal and breath sounds normal. No respiratory distress. He has no wheezes. He has no rales.  Abdominal: Soft. Bowel sounds are normal. He exhibits no distension. There is no tenderness. There is no rebound.  Genitourinary:  Rectal tone is normal. He  has large internal and external hemorrhoids. panendoscopy was performed confirming this  Musculoskeletal: Normal range of motion. He exhibits no edema and no tenderness.  Lymphadenopathy:  He has no cervical adenopathy.  Neurological: He is alert and oriented to person, place, and time.  Skin: Skin is warm and dry. No rash noted. He is not diaphoretic. No erythema.  Psychiatric: His behavior is normal. Judgment normal.  Data Reviewed   Assessment Bleeding internal and external hemorrhoids   Plan  As he has failed conservative management, operative hemorrhoidectomy is recommended. I discussed this with him in detail. I discussed the risks of surgery which includes but is not limited to bleeding, infection, recurrence, continence issues, etc. He understands and wishes to proceed. Surgery will be scheduled. It is still difficult to tell whether this is the source of his significant anemia. He recently was found to have a hemoglobin less than 7 and has since had transfusions. He may even require admission after surgery for further transfusions. He also understands that surgery may not change his anemia if there is another source

## 2012-11-27 ENCOUNTER — Ambulatory Visit (HOSPITAL_COMMUNITY): Payer: Federal, State, Local not specified - PPO | Admitting: Anesthesiology

## 2012-11-27 ENCOUNTER — Encounter (HOSPITAL_COMMUNITY): Payer: Self-pay | Admitting: *Deleted

## 2012-11-27 ENCOUNTER — Encounter (HOSPITAL_COMMUNITY): Payer: Self-pay | Admitting: Anesthesiology

## 2012-11-27 ENCOUNTER — Ambulatory Visit (HOSPITAL_COMMUNITY)
Admission: RE | Admit: 2012-11-27 | Discharge: 2012-11-27 | Disposition: A | Discharge status: Home / Self Care | Payer: Federal, State, Local not specified - PPO | Source: Ambulatory Visit | Attending: Surgery | Admitting: Surgery

## 2012-11-27 ENCOUNTER — Encounter (HOSPITAL_COMMUNITY)
Admission: RE | Disposition: A | Discharge status: Home / Self Care | Payer: Self-pay | Source: Ambulatory Visit | Attending: Surgery

## 2012-11-27 DIAGNOSIS — K644 Residual hemorrhoidal skin tags: Secondary | ICD-10-CM

## 2012-11-27 DIAGNOSIS — Z79899 Other long term (current) drug therapy: Secondary | ICD-10-CM | POA: Insufficient documentation

## 2012-11-27 DIAGNOSIS — D649 Anemia, unspecified: Secondary | ICD-10-CM | POA: Insufficient documentation

## 2012-11-27 DIAGNOSIS — K648 Other hemorrhoids: Secondary | ICD-10-CM | POA: Insufficient documentation

## 2012-11-27 HISTORY — PX: HEMORRHOID SURGERY: SHX153

## 2012-11-27 SURGERY — HEMORRHOIDECTOMY
Anesthesia: General | Site: Anus | Wound class: Clean Contaminated

## 2012-11-27 MED ORDER — ACETAMINOPHEN 650 MG RE SUPP
650.0000 mg | RECTAL | Status: DC | PRN
  Filled 2012-11-27: qty 1

## 2012-11-27 MED ORDER — HYDROCODONE-ACETAMINOPHEN 5-325 MG PO TABS: 1.0000 | ORAL_TABLET | ORAL | Status: DC | PRN

## 2012-11-27 MED ORDER — ONDANSETRON HCL 4 MG/2ML IJ SOLN: 4.0000 mg | Freq: Four times a day (QID) | INTRAMUSCULAR | Status: DC | PRN

## 2012-11-27 MED ORDER — FENTANYL CITRATE 0.05 MG/ML IJ SOLN
INTRAMUSCULAR | Status: DC | PRN
  Administered 2012-11-27 (×2): 50 ug via INTRAVENOUS

## 2012-11-27 MED ORDER — ACETAMINOPHEN 10 MG/ML IV SOLN
INTRAVENOUS | Status: DC | PRN
  Administered 2012-11-27: 1000 mg via INTRAVENOUS

## 2012-11-27 MED ORDER — DIBUCAINE 1 % RE OINT
TOPICAL_OINTMENT | RECTAL | Status: AC
  Filled 2012-11-27: qty 28

## 2012-11-27 MED ORDER — ONDANSETRON HCL 4 MG/2ML IJ SOLN
INTRAMUSCULAR | Status: DC | PRN
  Administered 2012-11-27: 4 mg via INTRAVENOUS

## 2012-11-27 MED ORDER — BUPIVACAINE HCL (PF) 0.5 % IJ SOLN
INTRAMUSCULAR | Status: AC
  Filled 2012-11-27: qty 30

## 2012-11-27 MED ORDER — BUPIVACAINE LIPOSOME 1.3 % IJ SUSP
INTRAMUSCULAR | Status: DC | PRN
  Administered 2012-11-27: 20 mL

## 2012-11-27 MED ORDER — KETOROLAC TROMETHAMINE 30 MG/ML IJ SOLN
INTRAMUSCULAR | Status: DC | PRN
  Administered 2012-11-27: 30 mg via INTRAVENOUS

## 2012-11-27 MED ORDER — CIPROFLOXACIN IN D5W 400 MG/200ML IV SOLN: 400.0000 mg | INTRAVENOUS | Status: DC

## 2012-11-27 MED ORDER — CIPROFLOXACIN IN D5W 400 MG/200ML IV SOLN
INTRAVENOUS | Status: DC | PRN
  Administered 2012-11-27: 400 mg via INTRAVENOUS

## 2012-11-27 MED ORDER — ACETAMINOPHEN 325 MG PO TABS: 650.0000 mg | ORAL_TABLET | ORAL | Status: DC | PRN

## 2012-11-27 MED ORDER — CIPROFLOXACIN IN D5W 400 MG/200ML IV SOLN
INTRAVENOUS | Status: AC
  Filled 2012-11-27: qty 200

## 2012-11-27 MED ORDER — MIDAZOLAM HCL 5 MG/5ML IJ SOLN
INTRAMUSCULAR | Status: DC | PRN
  Administered 2012-11-27: 2 mg via INTRAVENOUS

## 2012-11-27 MED ORDER — SODIUM CHLORIDE 0.9 % IJ SOLN: 3.0000 mL | INTRAMUSCULAR | Status: DC | PRN

## 2012-11-27 MED ORDER — 0.9 % SODIUM CHLORIDE (POUR BTL) OPTIME
TOPICAL | Status: DC | PRN
  Administered 2012-11-27: 1000 mL

## 2012-11-27 MED ORDER — OXYCODONE HCL 5 MG PO TABS: 5.0000 mg | ORAL_TABLET | ORAL | Status: DC | PRN

## 2012-11-27 MED ORDER — LACTATED RINGERS IV SOLN
INTRAVENOUS | Status: DC | PRN
  Administered 2012-11-27: 07:00:00 via INTRAVENOUS

## 2012-11-27 MED ORDER — BUPIVACAINE HCL (PF) 0.5 % IJ SOLN
INTRAMUSCULAR | Status: DC | PRN
  Administered 2012-11-27: 10 mL

## 2012-11-27 MED ORDER — PROPOFOL 10 MG/ML IV BOLUS
INTRAVENOUS | Status: DC | PRN
  Administered 2012-11-27: 200 mg via INTRAVENOUS

## 2012-11-27 MED ORDER — SODIUM CHLORIDE 0.9 % IV SOLN: 250.0000 mL | INTRAVENOUS | Status: DC | PRN

## 2012-11-27 MED ORDER — BUPIVACAINE LIPOSOME 1.3 % IJ SUSP
20.0000 mL | Freq: Once | INTRAMUSCULAR | Status: DC
  Filled 2012-11-27: qty 20

## 2012-11-27 MED ORDER — LIDOCAINE 5 % EX OINT
TOPICAL_OINTMENT | CUTANEOUS | Status: DC | PRN
Start: 2012-11-27 — End: 2012-12-04

## 2012-11-27 MED ORDER — MORPHINE SULFATE 10 MG/ML IJ SOLN: 4.0000 mg | INTRAMUSCULAR | Status: DC | PRN

## 2012-11-27 MED ORDER — SODIUM CHLORIDE 0.9 % IJ SOLN: 3.0000 mL | Freq: Two times a day (BID) | INTRAMUSCULAR | Status: DC

## 2012-11-27 MED ORDER — LIDOCAINE HCL (CARDIAC) 20 MG/ML IV SOLN
INTRAVENOUS | Status: DC | PRN
  Administered 2012-11-27: 100 mg via INTRAVENOUS

## 2012-11-27 MED ORDER — ACETAMINOPHEN 10 MG/ML IV SOLN
INTRAVENOUS | Status: AC
  Filled 2012-11-27: qty 100

## 2012-11-27 SURGICAL SUPPLY — 37 items
BLADE HEX COATED 2.75 (ELECTRODE) ×2 IMPLANT
BLADE SURG 15 STRL LF DISP TIS (BLADE) ×1 IMPLANT
BLADE SURG 15 STRL SS (BLADE) ×2
CANISTER SUCTION 2500CC (MISCELLANEOUS) ×2 IMPLANT
CLOTH BEACON ORANGE TIMEOUT ST (SAFETY) ×2 IMPLANT
DECANTER SPIKE VIAL GLASS SM (MISCELLANEOUS) ×2 IMPLANT
DRAPE LAPAROTOMY T 102X78X121 (DRAPES) IMPLANT
DRAPE LG THREE QUARTER DISP (DRAPES) IMPLANT
DRSG PAD ABDOMINAL 8X10 ST (GAUZE/BANDAGES/DRESSINGS) IMPLANT
ELECT REM PT RETURN 9FT ADLT (ELECTROSURGICAL) ×2
ELECTRODE REM PT RTRN 9FT ADLT (ELECTROSURGICAL) ×1 IMPLANT
GAUZE SPONGE 4X4 16PLY XRAY LF (GAUZE/BANDAGES/DRESSINGS) ×2 IMPLANT
GAUZE VASELINE 3X9 (GAUZE/BANDAGES/DRESSINGS) IMPLANT
GLOVE BIO SURGEON STRL SZ7 (GLOVE) ×2 IMPLANT
GLOVE BIOGEL PI IND STRL 7.0 (GLOVE) ×1 IMPLANT
GLOVE BIOGEL PI INDICATOR 7.0 (GLOVE) ×1
GLOVE SURG SIGNA 7.5 PF LTX (GLOVE) ×2 IMPLANT
GOWN STRL NON-REIN LRG LVL3 (GOWN DISPOSABLE) ×2 IMPLANT
GOWN STRL REIN XL XLG (GOWN DISPOSABLE) ×4 IMPLANT
KIT BASIN OR (CUSTOM PROCEDURE TRAY) ×2 IMPLANT
LUBRICANT JELLY K Y 4OZ (MISCELLANEOUS) ×2 IMPLANT
NDL HYPO 25X1 1.5 SAFETY (NEEDLE) ×1 IMPLANT
NDL SAFETY ECLIPSE 18X1.5 (NEEDLE) ×1 IMPLANT
NEEDLE HYPO 18GX1.5 SHARP (NEEDLE) ×2
NEEDLE HYPO 25X1 1.5 SAFETY (NEEDLE) ×2 IMPLANT
NS IRRIG 1000ML POUR BTL (IV SOLUTION) ×2 IMPLANT
PACK LITHOTOMY IV (CUSTOM PROCEDURE TRAY) ×2 IMPLANT
PENCIL BUTTON HOLSTER BLD 10FT (ELECTRODE) ×2 IMPLANT
SHEARS HARMONIC 9CM CVD (BLADE) ×1 IMPLANT
SPONGE GAUZE 4X4 12PLY (GAUZE/BANDAGES/DRESSINGS) ×1 IMPLANT
SPONGE HEMORRHOID 8X3CM (HEMOSTASIS) ×1 IMPLANT
SPONGE SURGIFOAM ABS GEL 12-7 (HEMOSTASIS) ×2 IMPLANT
SUT CHROMIC 2 0 SH (SUTURE) ×4 IMPLANT
SUT CHROMIC 3 0 SH 27 (SUTURE) IMPLANT
SYR CONTROL 10ML LL (SYRINGE) ×2 IMPLANT
TOWEL OR 17X26 10 PK STRL BLUE (TOWEL DISPOSABLE) ×2 IMPLANT
YANKAUER SUCT BULB TIP 10FT TU (MISCELLANEOUS) ×2 IMPLANT

## 2012-11-27 NOTE — Interval H&P Note (Signed)
History and Physical Interval Note: no change in the H and P  11/27/2012 6:39 AM  Andrew Villegas  has presented today for surgery, with the diagnosis of bleeding int/ ext hemrroids  The various methods of treatment have been discussed with the patient and family. After consideration of risks, benefits and other options for treatment, the patient has consented to  Procedure(s): HEMORRHOIDECTOMY (N/A) as a surgical intervention .  The patient's history has been reviewed, patient examined, no change in status, stable for surgery.  I have reviewed the patient's chart and labs.  Questions were answered to the patient's satisfaction.     Particia Strahm A

## 2012-11-27 NOTE — Op Note (Signed)
HEMORRHOIDECTOMY  Procedure Note  Andrew Villegas 11/27/2012   Pre-op Diagnosis: bleeding int/ ext hemrroids     Post-op Diagnosis: same  Procedure(s): HEMORRHOIDECTOMY  Surgeon(s): Shelly Rubenstein, MD  Anesthesia: General  Staff:  Circulator: Loann Quill Dertouzos, RN Scrub Person: Tammy Gordy Levan, CST Circulator Assistant: Gerda Diss, RN  Estimated Blood Loss: Minimal               Specimens: sent to path          [facility]   Date: 11/27/2012  Time: 7:51 AM

## 2012-11-27 NOTE — Anesthesia Procedure Notes (Addendum)
Procedure Name: LMA Insertion Date/Time: 11/27/2012 7:12 AM Performed by: Doran Clay Pre-anesthesia Checklist: Patient identified, Emergency Drugs available, Suction available, Patient being monitored and Timeout performed Patient Re-evaluated:Patient Re-evaluated prior to inductionOxygen Delivery Method: Circle system utilized Preoxygenation: Pre-oxygenation with 100% oxygen Intubation Type: IV induction Ventilation: Mask ventilation without difficulty LMA: LMA inserted LMA Size: 5.0 Placement Confirmation: positive ETCO2 Tube secured with: Tape

## 2012-11-27 NOTE — Progress Notes (Signed)
Patient received 2 units of blood 11/17/12 and his HGB was 9.7 on 11/23/12

## 2012-11-27 NOTE — Transfer of Care (Signed)
Immediate Anesthesia Transfer of Care Note  Patient: Andrew Villegas  Procedure(s) Performed: Procedure(s): HEMORRHOIDECTOMY (N/A)  Patient Location: PACU  Anesthesia Type:General  Level of Consciousness: sedated  Airway & Oxygen Therapy: Patient Spontanous Breathing and Patient connected to face mask oxygen  Post-op Assessment: Report given to PACU RN and Post -op Vital signs reviewed and stable  Post vital signs: Reviewed and stable  Complications: No apparent anesthesia complications

## 2012-11-27 NOTE — Anesthesia Postprocedure Evaluation (Signed)
  Anesthesia Post-op Note  Patient: Andrew Villegas  Procedure(s) Performed: Procedure(s) (LRB): HEMORRHOIDECTOMY (N/A)  Patient Location: PACU  Anesthesia Type: General  Level of Consciousness: awake and alert   Airway and Oxygen Therapy: Patient Spontanous Breathing  Post-op Pain: mild  Post-op Assessment: Post-op Vital signs reviewed, Patient's Cardiovascular Status Stable, Respiratory Function Stable, Patent Airway and No signs of Nausea or vomiting  Last Vitals:  Filed Vitals:   11/27/12 0836  BP: 111/72  Pulse: 65  Temp: 36.5 C  Resp: 14    Post-op Vital Signs: stable   Complications: No apparent anesthesia complications

## 2012-11-27 NOTE — Anesthesia Preprocedure Evaluation (Addendum)
Anesthesia Evaluation  Patient identified by MRN, date of birth, ID band Patient awake    Reviewed: Allergy & Precautions, H&P , NPO status , Patient's Chart, lab work & pertinent test results  Airway Mallampati: II TM Distance: >3 FB Neck ROM: Full    Dental no notable dental hx.    Pulmonary neg pulmonary ROS, neg shortness of breath, neg sleep apnea,  breath sounds clear to auscultation  Pulmonary exam normal       Cardiovascular - Peripheral Vascular Disease negative cardio ROS  Rhythm:Regular Rate:Normal     Neuro/Psych negative neurological ROS  negative psych ROS   GI/Hepatic negative GI ROS, Neg liver ROS,   Endo/Other  negative endocrine ROS  Renal/GU negative Renal ROS  negative genitourinary   Musculoskeletal negative musculoskeletal ROS (+)   Abdominal   Peds negative pediatric ROS (+)  Hematology negative hematology ROS (+)   Anesthesia Other Findings   Reproductive/Obstetrics negative OB ROS                           Anesthesia Physical Anesthesia Plan  ASA: II  Anesthesia Plan: General   Post-op Pain Management:    Induction: Intravenous  Airway Management Planned: LMA  Additional Equipment:   Intra-op Plan:   Post-operative Plan: Extubation in OR  Informed Consent: I have reviewed the patients History and Physical, chart, labs and discussed the procedure including the risks, benefits and alternatives for the proposed anesthesia with the patient or authorized representative who has indicated his/her understanding and acceptance.   Dental advisory given  Plan Discussed with: CRNA  Anesthesia Plan Comments:        Anesthesia Quick Evaluation

## 2012-11-27 NOTE — Op Note (Signed)
[name], [name] NO.:  [id number]  MEDICAL RECORD NO.:  [id number]  LOCATION:  [facility]                         FACILITY:  [facility]  PHYSICIAN:  Abigail Miyamoto, M.D. DATE OF BIRTH:  1960-08-20  DATE OF PROCEDURE:  11/27/2012 DATE OF DISCHARGE:                              OPERATIVE REPORT   PREOPERATIVE DIAGNOSIS:  Bleeding internal hemorrhoids.  POSTOPERATIVE DIAGNOSIS:  Bleeding internal hemorrhoids.  PROCEDURE:  Two column internal and external hemorrhoidectomy.  SURGEON:  Dr. Abigail Miyamoto, M.D.  ANESTHESIA:  General, 0.5% Marcaine and Exparel.  ESTIMATED BLOOD LOSS:  Minimal.  INDICATION:  This is a 52 year old gentleman who has had significant bleeding hemorrhoids.  He is even bled down to a hemoglobin of 6.9, requiring transfusions.  He has had a workup by hematologist and gastroenterologist.  Decision was made to proceed with hemorrhoidectomy in hope this will control of his anemia.  PROCEDURE IN DETAIL:  The patient was brought to the operating room, identified as Andrew Villegas.  He was placed supine on the operating room table, and general anesthesia was induced.  He was then placed in lithotomy position.  His perianal area was then prepped and draped in usual sterile fashion.  He had 2 large internal and external hemorrhoidal columns at the 5 o'clock and 7 o'clock position with the patient in the lithotomy position.  I saw no other intra-anal pathology. I excised both the hemorrhoids with the Harmonic Scalpel, and then still closed the mucosal defect with interlocked 2-0 chromic sutures. Hemostasis appeared to be achieved.  I placed a piece of Gelfoam into the anal canal.  I then injected Exparel and Marcaine circumferentially around the perianal area.  Hemostasis appeared to be achieved.  All counts were correct at the end of the case.  The patient was then extubated in the operating room and taken in a stable condition to the recovery  room.     Abigail Miyamoto, M.D.     DB/MEDQ  D:  11/27/2012  T:  11/27/2012  Job:  [id number]

## 2012-11-28 ENCOUNTER — Encounter (HOSPITAL_COMMUNITY): Payer: Self-pay | Admitting: Surgery

## 2012-11-29 ENCOUNTER — Other Ambulatory Visit: Payer: Federal, State, Local not specified - PPO | Admitting: Lab

## 2012-11-29 ENCOUNTER — Ambulatory Visit: Payer: Federal, State, Local not specified - PPO | Admitting: Oncology

## 2012-12-01 ENCOUNTER — Telehealth (INDEPENDENT_AMBULATORY_CARE_PROVIDER_SITE_OTHER): Payer: Self-pay | Admitting: *Deleted

## 2012-12-01 NOTE — Telephone Encounter (Signed)
Spoke to Sharon MD who advised to call in and prescribe Flomax 0.4mg  Take 1 tablet per day for the next 14 days to help with urination.  #14 no refills. Patient made aware and agreeable at this time.

## 2012-12-01 NOTE — Telephone Encounter (Signed)
Patient called concerned that he is having difficulty urinating.  He is able to urinate but has some pressure.  Patient asked for any suggestions.  Patient states he is doing multiple soaks throughout the day.  Paged Magnus Ivan MD at this time, awaiting response.

## 2012-12-04 ENCOUNTER — Other Ambulatory Visit (INDEPENDENT_AMBULATORY_CARE_PROVIDER_SITE_OTHER): Payer: Self-pay | Admitting: Surgery

## 2012-12-12 ENCOUNTER — Ambulatory Visit (INDEPENDENT_AMBULATORY_CARE_PROVIDER_SITE_OTHER): Payer: Federal, State, Local not specified - PPO | Admitting: Surgery

## 2012-12-12 ENCOUNTER — Encounter (INDEPENDENT_AMBULATORY_CARE_PROVIDER_SITE_OTHER): Payer: Self-pay | Admitting: Surgery

## 2012-12-12 VITALS — BP 114/70 | HR 60 | Temp 97.1°F | Resp 16 | Ht 72.0 in | Wt 194.8 lb

## 2012-12-12 DIAGNOSIS — Z09 Encounter for follow-up examination after completed treatment for conditions other than malignant neoplasm: Secondary | ICD-10-CM

## 2012-12-12 MED ORDER — LIDOCAINE 5 % EX OINT: TOPICAL_OINTMENT | CUTANEOUS | Status: DC | PRN

## 2012-12-12 NOTE — Progress Notes (Signed)
Subjective:     Patient ID: Andrew Villegas, male   DOB: 08-26-1960, 52 y.o.   MRN: [id number]  HPI He is here for his first postop visit status post hemorrhoidectomy. He has minimal bleeding and minimal discomfort. Again, he has a significant history of anemia. He will be seen the hematologist again in July  Review of Systems     Objective:   Physical Exam On exam, the hemorrhoidectomy incisions have the typical breakdown with excellent granulation tissue. The final pathology confirmed hemorrhoids    Assessment:     Patient stable postop     Plan:     I will renew his lidocaine cream. He will continue sitz baths and stool softeners. I will see him back in one month

## 2013-01-16 ENCOUNTER — Other Ambulatory Visit: Payer: Self-pay | Admitting: Hematology and Oncology

## 2013-01-16 ENCOUNTER — Encounter (INDEPENDENT_AMBULATORY_CARE_PROVIDER_SITE_OTHER): Payer: Self-pay | Admitting: Surgery

## 2013-01-16 ENCOUNTER — Ambulatory Visit (INDEPENDENT_AMBULATORY_CARE_PROVIDER_SITE_OTHER): Payer: Federal, State, Local not specified - PPO | Admitting: Surgery

## 2013-01-16 VITALS — BP 112/70 | HR 84 | Temp 97.6°F | Resp 16 | Ht 72.0 in | Wt 199.4 lb

## 2013-01-16 DIAGNOSIS — Z09 Encounter for follow-up examination after completed treatment for conditions other than malignant neoplasm: Secondary | ICD-10-CM

## 2013-01-16 DIAGNOSIS — D539 Nutritional anemia, unspecified: Secondary | ICD-10-CM

## 2013-01-16 NOTE — Progress Notes (Signed)
Subjective:     Patient ID: Andrew Villegas, male   DOB: 14-Aug-1960, 52 y.o.   MRN: [id number]  HPI He is here for another postoperative visit. He is doing well has minimal perianal discomfort and no drainage.  Review of Systems     Objective:   Physical Exam  On exam, his wounds are almost completely healed    Assessment:     Patient did well postop hemorrhoidectomy     Plan:     He may resume his normal activity. I will see him back as needed

## 2013-01-26 ENCOUNTER — Other Ambulatory Visit: Payer: Self-pay | Admitting: Oncology

## 2013-01-26 ENCOUNTER — Other Ambulatory Visit (HOSPITAL_BASED_OUTPATIENT_CLINIC_OR_DEPARTMENT_OTHER): Payer: Federal, State, Local not specified - PPO | Admitting: Lab

## 2013-01-26 DIAGNOSIS — D500 Iron deficiency anemia secondary to blood loss (chronic): Secondary | ICD-10-CM

## 2013-01-26 LAB — CBC WITH DIFFERENTIAL/PLATELET
BASO%: 1.3 % (ref 0.0–2.0)
EOS%: 2.3 % (ref 0.0–7.0)
LYMPH%: 34.8 % (ref 14.0–49.0)
MCHC: 32.2 g/dL (ref 32.0–36.0)
MCV: 75.5 fL — ABNORMAL LOW (ref 79.3–98.0)
MONO#: 0.4 10*3/uL (ref 0.1–0.9)
MONO%: 12.2 % (ref 0.0–14.0)
Platelets: 139 10*3/uL — ABNORMAL LOW (ref 140–400)
RBC: 4.74 10*6/uL (ref 4.20–5.82)
WBC: 3.3 10*3/uL — ABNORMAL LOW (ref 4.0–10.3)

## 2013-01-26 LAB — FERRITIN CHCC: Ferritin: 6 ng/ml — ABNORMAL LOW (ref 22–316)

## 2013-05-30 ENCOUNTER — Other Ambulatory Visit (HOSPITAL_BASED_OUTPATIENT_CLINIC_OR_DEPARTMENT_OTHER): Payer: Federal, State, Local not specified - PPO

## 2013-05-30 ENCOUNTER — Other Ambulatory Visit: Payer: Self-pay | Admitting: Hematology and Oncology

## 2013-05-30 DIAGNOSIS — D509 Iron deficiency anemia, unspecified: Secondary | ICD-10-CM

## 2013-05-30 LAB — COMPREHENSIVE METABOLIC PANEL (CC13)
ALT: 23 U/L (ref 0–55)
AST: 23 U/L (ref 5–34)
Anion Gap: 7 mEq/L (ref 3–11)
BUN: 13.3 mg/dL (ref 7.0–26.0)
Creatinine: 1 mg/dL (ref 0.7–1.3)
Total Bilirubin: 0.86 mg/dL (ref 0.20–1.20)

## 2013-05-30 LAB — CBC & DIFF AND RETIC
BASO%: 0.7 % (ref 0.0–2.0)
EOS%: 2.7 % (ref 0.0–7.0)
Immature Retic Fract: 3.1 % (ref 3.00–10.60)
MCH: 29.3 pg (ref 27.2–33.4)
MCHC: 34.4 g/dL (ref 32.0–36.0)
RBC: 5.05 10*6/uL (ref 4.20–5.82)
RDW: 13.5 % (ref 11.0–14.6)
Retic Ct Abs: 57.57 10*3/uL (ref 34.80–93.90)
lymph#: 1.4 10*3/uL (ref 0.9–3.3)

## 2013-05-31 ENCOUNTER — Telehealth: Payer: Self-pay | Admitting: Hematology and Oncology

## 2013-05-31 NOTE — Telephone Encounter (Signed)
, °

## 2013-08-10 ENCOUNTER — Telehealth: Payer: Self-pay | Admitting: Hematology and Oncology

## 2013-08-10 NOTE — Telephone Encounter (Signed)
Talked to pt and lab appt moved to 09/20/13 0800

## 2013-09-20 ENCOUNTER — Other Ambulatory Visit (HOSPITAL_BASED_OUTPATIENT_CLINIC_OR_DEPARTMENT_OTHER): Payer: Federal, State, Local not specified - PPO

## 2013-09-20 DIAGNOSIS — D509 Iron deficiency anemia, unspecified: Secondary | ICD-10-CM

## 2013-09-20 LAB — CBC & DIFF AND RETIC
BASO%: 0.6 % (ref 0.0–2.0)
Basophils Absolute: 0 10*3/uL (ref 0.0–0.1)
EOS%: 2.9 % (ref 0.0–7.0)
Eosinophils Absolute: 0.1 10*3/uL (ref 0.0–0.5)
HEMATOCRIT: 42.8 % (ref 38.4–49.9)
HGB: 14.9 g/dL (ref 13.0–17.1)
Immature Retic Fract: 7.2 % (ref 3.00–10.60)
LYMPH#: 1.3 10*3/uL (ref 0.9–3.3)
LYMPH%: 36.3 % (ref 14.0–49.0)
MCH: 30.4 pg (ref 27.2–33.4)
MCHC: 34.8 g/dL (ref 32.0–36.0)
MCV: 87.3 fL (ref 79.3–98.0)
MONO#: 0.4 10*3/uL (ref 0.1–0.9)
MONO%: 11.1 % (ref 0.0–14.0)
NEUT#: 1.7 10*3/uL (ref 1.5–6.5)
NEUT%: 49.1 % (ref 39.0–75.0)
PLATELETS: 144 10*3/uL (ref 140–400)
RBC: 4.9 10*6/uL (ref 4.20–5.82)
RDW: 12.6 % (ref 11.0–14.6)
Retic %: 1.51 % (ref 0.80–1.80)
Retic Ct Abs: 73.99 10*3/uL (ref 34.80–93.90)
WBC: 3.5 10*3/uL — ABNORMAL LOW (ref 4.0–10.3)
nRBC: 0 % (ref 0–0)

## 2013-09-20 LAB — COMPREHENSIVE METABOLIC PANEL (CC13)
ALT: 44 U/L (ref 0–55)
ANION GAP: 7 meq/L (ref 3–11)
AST: 34 U/L (ref 5–34)
Albumin: 3.9 g/dL (ref 3.5–5.0)
Alkaline Phosphatase: 83 U/L (ref 40–150)
BUN: 12.3 mg/dL (ref 7.0–26.0)
CALCIUM: 9.4 mg/dL (ref 8.4–10.4)
CHLORIDE: 108 meq/L (ref 98–109)
CO2: 28 meq/L (ref 22–29)
CREATININE: 1 mg/dL (ref 0.7–1.3)
Glucose: 103 mg/dl (ref 70–140)
Potassium: 4.1 mEq/L (ref 3.5–5.1)
Sodium: 142 mEq/L (ref 136–145)
Total Bilirubin: 0.6 mg/dL (ref 0.20–1.20)
Total Protein: 7 g/dL (ref 6.4–8.3)

## 2013-09-20 LAB — FERRITIN CHCC: FERRITIN: 60 ng/mL (ref 22–316)

## 2013-09-21 ENCOUNTER — Other Ambulatory Visit: Payer: Federal, State, Local not specified - PPO

## 2013-09-27 ENCOUNTER — Ambulatory Visit (HOSPITAL_BASED_OUTPATIENT_CLINIC_OR_DEPARTMENT_OTHER): Payer: Federal, State, Local not specified - PPO | Admitting: Hematology and Oncology

## 2013-09-27 ENCOUNTER — Ambulatory Visit: Payer: Federal, State, Local not specified - PPO | Admitting: Oncology

## 2013-09-27 ENCOUNTER — Encounter: Payer: Self-pay | Admitting: Hematology and Oncology

## 2013-09-27 VITALS — BP 113/64 | HR 64 | Temp 97.6°F | Resp 18 | Ht 72.0 in | Wt 207.1 lb

## 2013-09-27 DIAGNOSIS — Z862 Personal history of diseases of the blood and blood-forming organs and certain disorders involving the immune mechanism: Secondary | ICD-10-CM

## 2013-09-27 DIAGNOSIS — D72819 Decreased white blood cell count, unspecified: Secondary | ICD-10-CM

## 2013-09-27 HISTORY — DX: Decreased white blood cell count, unspecified: D72.819

## 2013-09-27 NOTE — Progress Notes (Signed)
[facility] OFFICE PROGRESS NOTE  Rudi HeapMOORE, DONALD, MD DIAGNOSIS:  History of iron deficiency anemia secondary to chronic hemorrhoidal bleeding, resolved  SUMMARY OF HEMATOLOGIC HISTORY: This patient was seen here many years ago for severe iron deficiency anemia due to chronic bleeding. EGD and colonoscopy in 2010 was negative apart from hemorrhoids. In May of 2014, he underwent surgical resection of the bleeding hemorrhoid. The patient was also placed on oral ion supplements. INTERVAL HISTORY: Andrew Villegas 53 y.o. male returns for further followup. He denies any further bleeding. Complain of some fatigue.  I have reviewed the past medical history, past surgical history, social history and family history with the patient and they are unchanged from previous note.  ALLERGIES:  is allergic to penicillins.  MEDICATIONS:  Current Outpatient Prescriptions  Medication Sig Dispense Refill  . acetaminophen (TYLENOL) 500 MG tablet Take 500 mg by mouth every 6 (six) hours as needed for pain.      Marland Kitchen. docusate sodium (COLACE) 100 MG capsule Take 100 mg by mouth as needed for mild constipation.      . ferrous sulfate 325 (65 FE) MG tablet Take 325 mg by mouth daily with breakfast.      . fluticasone (FLONASE) 50 MCG/ACT nasal spray Place 1 spray into the nose daily.  16 g  6  . loratadine (CLARITIN) 10 MG tablet Take 10 mg by mouth daily.      . Multiple Vitamins-Minerals (CENTRUM SILVER PO) Take by mouth.       No current facility-administered medications for this visit.     REVIEW OF SYSTEMS:   All other systems were reviewed with the patient and are negative.  PHYSICAL EXAMINATION: ECOG PERFORMANCE STATUS: 0 - Asymptomatic  Filed Vitals:   09/27/13 0821  BP: 113/64  Pulse: 64  Temp: 97.6 F (36.4 C)  Resp: 18   Filed Weights   09/27/13 0821  Weight: 207 lb 1.6 oz (93.94 kg)    GENERAL:alert, no distress and comfortable SKIN: skin color, texture, turgor are normal,  no rashes or significant lesions NEURO: alert & oriented x 3 with fluent speech, no focal motor/sensory deficits  LABORATORY DATA:  I have reviewed the data as listed No results found for this or any previous visit (from the past 48 hour(s)).  Lab Results  Component Value Date   WBC 3.5* 09/20/2013   HGB 14.9 09/20/2013   HCT 42.8 09/20/2013   MCV 87.3 09/20/2013   PLT 144 09/20/2013   ASSESSMENT & PLAN:  #1 history of iron deficiency anemia, resolved I told the patient to finish the rest of his oral ion supplements and discontinue afterwards. He does not need further followup here. 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 However, this appeared to be fluctuating on multiple prior blood works. The patient is not symptomatic. I do not recommend any evaluation. Recommend observation only. This is likely benign in nature. All questions were answered. The patient knows to call the clinic with any problems, questions or concerns. No barriers to learning was detected.  I spent 10 minutes counseling the patient face to face. The total time spent in the appointment was 15 minutes and more than 50% was on counseling.     [facility] Charles Parish HospitalGORSUCH, Angeliki Mates, MD 09/27/2013 8:37 AM

## 2014-03-20 ENCOUNTER — Telehealth: Payer: Self-pay | Admitting: Family Medicine

## 2014-03-20 NOTE — Telephone Encounter (Signed)
appt given for Friday with Mary Martin 

## 2014-03-22 ENCOUNTER — Ambulatory Visit (INDEPENDENT_AMBULATORY_CARE_PROVIDER_SITE_OTHER): Payer: Federal, State, Local not specified - PPO | Admitting: Nurse Practitioner

## 2014-03-22 ENCOUNTER — Encounter: Payer: Self-pay | Admitting: Nurse Practitioner

## 2014-03-22 VITALS — BP 116/81 | HR 64 | Temp 97.1°F | Ht 72.0 in | Wt 209.0 lb

## 2014-03-22 DIAGNOSIS — R5381 Other malaise: Secondary | ICD-10-CM

## 2014-03-22 DIAGNOSIS — D649 Anemia, unspecified: Secondary | ICD-10-CM

## 2014-03-22 DIAGNOSIS — R5383 Other fatigue: Secondary | ICD-10-CM

## 2014-03-22 DIAGNOSIS — Z125 Encounter for screening for malignant neoplasm of prostate: Secondary | ICD-10-CM

## 2014-03-22 LAB — POCT CBC
Granulocyte percent: 66.5 %G (ref 37–80)
HCT, POC: 47 % (ref 43.5–53.7)
Hemoglobin: 15.9 g/dL (ref 14.1–18.1)
LYMPH, POC: 1.3 (ref 0.6–3.4)
MCH: 29.9 pg (ref 27–31.2)
MCHC: 33.9 g/dL (ref 31.8–35.4)
MCV: 88.2 fL (ref 80–97)
MPV: 7.4 fL (ref 0–99.8)
PLATELET COUNT, POC: 159 10*3/uL (ref 142–424)
POC Granulocyte: 2.8 (ref 2–6.9)
POC LYMPH PERCENT: 30.2 %L (ref 10–50)
RBC: 5.3 M/uL (ref 4.69–6.13)
RDW, POC: 12.5 %
WBC: 4.2 10*3/uL — AB (ref 4.6–10.2)

## 2014-03-22 NOTE — Progress Notes (Signed)
   Subjective:    Patient ID: Andrew Villegas, male    DOB: [birth year], 53 y.o.   MRN: [id number]  HPI Patient has a history of leukopenia- he says that he started feeling weak and tried for about 1 week and has worsened the last 3 days- This is usually how he feels when his blood count is low. Never have found a true cause for blood loss and low iron- He has had to have transfusions in the past.    Review of Systems  Constitutional: Positive for fatigue.  HENT: Negative.   Respiratory: Negative.   Cardiovascular: Negative.   Gastrointestinal: Negative.   Genitourinary: Negative.   Neurological: Negative.   Psychiatric/Behavioral: Negative.   All other systems reviewed and are negative.      Objective:   Physical Exam  Constitutional: He is oriented to person, place, and time. He appears well-developed and well-nourished.  Cardiovascular: Normal rate, regular rhythm and normal heart sounds.   Pulmonary/Chest: Effort normal and breath sounds normal.  Neurological: He is alert and oriented to person, place, and time. He has normal reflexes. No cranial nerve deficit.  Skin: Skin is warm and dry. There is pallor.  Psychiatric: He has a normal mood and affect. His behavior is normal. Judgment and thought content normal.   BP 116/81  Pulse 64  Temp(Src) 97.1 F (36.2 C) (Oral)  Ht 6' (1.829 m)  Wt 209 lb (94.802 kg)  BMI 28.34 kg/m2  Results for orders placed in visit on 03/22/14  POCT CBC      Result Value Ref Range   WBC 4.2 (*) 4.6 - 10.2 K/uL   Lymph, poc 1.3  0.6 - 3.4   POC LYMPH PERCENT 30.2  10 - 50 %L   POC Granulocyte 2.8  2 - 6.9   Granulocyte percent 66.5  37 - 80 %G   RBC 5.3  4.69 - 6.13 M/uL   Hemoglobin 15.9  14.1 - 18.1 g/dL   HCT, POC 47.0  43.5 - 53.7 %   MCV 88.2  80 - 97 fL   MCH, POC 29.9  27 - 31.2 pg   MCHC 33.9  31.8 - 35.4 g/dL   RDW, POC 12.5     Platelet Count, POC 159.0  142 - 424 K/uL   MPV 7.4  0 - 99.8 fL          Assessment & Plan:     1. Anemia, unspecified anemia type   2. Other malaise and fatigue   3. Prostate cancer screening    Orders Placed This Encounter  Procedures  . CMP14+EGFR  . Lyme Ab/Western Blot Reflex  . Rocky mtn spotted fvr abs pnl(IgG+IgM)  . NMR, lipoprofile  . PSA, total and free  . Thyroid Panel With TSH  . POCT CBC   Labs pending Rest Force fluids Make appointment for physical exam  Mary-Margaret Hassell Done, FNP

## 2014-03-22 NOTE — Patient Instructions (Signed)

## 2014-03-29 LAB — CMP14+EGFR

## 2014-03-29 LAB — THYROID PANEL WITH TSH: TSH: 2.65 u[IU]/mL (ref 0.450–4.500)

## 2014-03-29 LAB — NMR, LIPOPROFILE
Cholesterol: 227 mg/dL — ABNORMAL HIGH (ref 100–199)
HDL CHOLESTEROL BY NMR: 45 mg/dL (ref >39)
HDL Particle Number: 34.8 umol/L (ref >=30.5)
LDL Particle Number: 2018 nmol/L — ABNORMAL HIGH (ref <1000)
LDL SIZE: 20.8 nm (ref >20.5)
LDLC SERPL CALC-MCNC: 153 mg/dL — ABNORMAL HIGH (ref 0–99)
LP-IR Score: 77 — ABNORMAL HIGH (ref <=45)
SMALL LDL PARTICLE NUMBER: 1072 nmol/L — AB (ref <=527)
Triglycerides by NMR: 143 mg/dL (ref 0–149)

## 2014-03-29 LAB — PSA, TOTAL AND FREE

## 2014-03-29 LAB — LYME AB/WESTERN BLOT REFLEX: LYME DISEASE AB, QUANT, IGM: <0.8 index (ref 0.00–0.79)

## 2014-03-29 LAB — ROCKY MTN SPOTTED FVR ABS PNL(IGG+IGM)

## 2014-04-04 ENCOUNTER — Other Ambulatory Visit: Payer: Federal, State, Local not specified - PPO

## 2014-04-04 NOTE — Addendum Note (Signed)
Addended by: Monica Becton on: 04/04/2014 08:34 AM   Modules accepted: Orders

## 2014-04-04 NOTE — Addendum Note (Signed)
Addended by: Monica Becton on: 04/04/2014 08:33 AM   Modules accepted: Orders

## 2014-04-04 NOTE — Progress Notes (Signed)
LAB ONLY NO CHARGE LAB CORP ERROR

## 2014-04-06 LAB — CMP14+EGFR
A/G RATIO: 2 (ref 1.1–2.5)
ALBUMIN: 4.6 g/dL (ref 3.5–5.5)
ALK PHOS: 82 IU/L (ref 39–117)
ALT: 27 IU/L (ref 0–44)
AST: 22 IU/L (ref 0–40)
BILIRUBIN TOTAL: 0.9 mg/dL (ref 0.0–1.2)
BUN / CREAT RATIO: 14 (ref 9–20)
BUN: 16 mg/dL (ref 6–24)
CO2: 23 mmol/L (ref 18–29)
CREATININE: 1.11 mg/dL (ref 0.76–1.27)
Calcium: 9.4 mg/dL (ref 8.7–10.2)
Chloride: 101 mmol/L (ref 97–108)
GFR, EST AFRICAN AMERICAN: 88 mL/min/{1.73_m2} (ref >59)
GFR, EST NON AFRICAN AMERICAN: 76 mL/min/{1.73_m2} (ref >59)
GLOBULIN, TOTAL: 2.3 g/dL (ref 1.5–4.5)
Glucose: 86 mg/dL (ref 65–99)
Potassium: 3.9 mmol/L (ref 3.5–5.2)
Sodium: 140 mmol/L (ref 134–144)
Total Protein: 6.9 g/dL (ref 6.0–8.5)

## 2014-04-06 LAB — PSA, TOTAL AND FREE
PSA FREE: 0.28 ng/mL
PSA, Free Pct: 46.7 %
PSA: 0.6 ng/mL (ref 0.0–4.0)

## 2014-04-06 LAB — THYROID PANEL WITH TSH
Free Thyroxine Index: 2.6 (ref 1.2–4.9)
T3 Uptake Ratio: 31 % (ref 24–39)
T4 TOTAL: 8.5 ug/dL (ref 4.5–12.0)
TSH: 2.02 u[IU]/mL (ref 0.450–4.500)

## 2014-04-06 LAB — ROCKY MTN SPOTTED FVR ABS PNL(IGG+IGM)
RMSF IgG: NEGATIVE
RMSF IgM: 0.16 index (ref 0.00–0.89)

## 2014-04-06 LAB — LYME AB/WESTERN BLOT REFLEX
LYME DISEASE AB, QUANT, IGM: <0.8 index (ref 0.00–0.79)
Lyme IgG/IgM Ab: <0.91 {ISR} (ref 0.00–0.90)

## 2014-04-08 ENCOUNTER — Encounter: Payer: Self-pay | Admitting: *Deleted

## 2014-08-06 ENCOUNTER — Ambulatory Visit (INDEPENDENT_AMBULATORY_CARE_PROVIDER_SITE_OTHER): Payer: Federal, State, Local not specified - PPO | Admitting: Family Medicine

## 2014-08-06 VITALS — BP 124/83 | HR 79 | Temp 98.3°F | Ht 72.0 in | Wt 208.0 lb

## 2014-08-06 DIAGNOSIS — S61219A Laceration without foreign body of unspecified finger without damage to nail, initial encounter: Secondary | ICD-10-CM

## 2014-08-06 DIAGNOSIS — Z23 Encounter for immunization: Secondary | ICD-10-CM

## 2014-08-06 NOTE — Patient Instructions (Signed)
The wound is cleansed, debrided of foreign material as much as possible, and dressed. The patient is alerted to watch for any signs of infection (redness, pus, pain, increased swelling or fever) and call if such occurs. Home wound care instructions are provided. Tetanus vaccination status reviewed: Td vaccination indicated and given today.

## 2014-08-06 NOTE — Progress Notes (Signed)
   Subjective:    Patient ID: Andrew Villegas, Andrew Villegas    DOB: 02/25/1961, 54 y.o.   MRN: [id number]  HPI Patient is here today for a finger laceration to Right index finger.  He cut his finger sorting thru a toolbox on a razor blade.Occurred 15 min ago. Able to move the joint. No contaminant exposure. 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 Chart review showed 2009 as last dT.        Review of Systems     Objective:   Physical Exam BP 124/83 mmHg  Pulse 79  Temp(Src) 98.3 F (36.8 C)  Wt 208 lb (94.348 kg)  Right dorsal 2nd finger has well opposed, clean laceration at the midway point between the PIP and MCP. It runs horizontally and is through the dermis. There is no exposure of subcutaneous structure such as ligaments tendons or musculature. There is full range of motion of the extremity. It measures 7 mm in length.     Assessment & Plan:  Laceration, closed with durabond. Wound care reviewed. S&S infection reviewed

## 2014-08-30 ENCOUNTER — Encounter (INDEPENDENT_AMBULATORY_CARE_PROVIDER_SITE_OTHER): Payer: Self-pay

## 2014-08-30 ENCOUNTER — Encounter: Payer: Self-pay | Admitting: Family Medicine

## 2014-08-30 ENCOUNTER — Ambulatory Visit (INDEPENDENT_AMBULATORY_CARE_PROVIDER_SITE_OTHER): Payer: Federal, State, Local not specified - PPO | Admitting: Family Medicine

## 2014-08-30 VITALS — BP 115/71 | HR 88 | Temp 98.0°F | Ht 72.0 in | Wt 204.4 lb

## 2014-08-30 DIAGNOSIS — J0100 Acute maxillary sinusitis, unspecified: Secondary | ICD-10-CM

## 2014-08-30 MED ORDER — METHYLPREDNISOLONE (PAK) 4 MG PO TABS: ORAL_TABLET | ORAL | Status: DC

## 2014-08-30 MED ORDER — LEVOFLOXACIN 500 MG PO TABS: 500.0000 mg | ORAL_TABLET | Freq: Every day | ORAL | Status: DC

## 2014-08-30 NOTE — Progress Notes (Signed)
   Subjective:    Patient ID: Andrew Villegas, male    DOB: 09/10/1960, 54 y.o.   MRN: [id number]  HPI C/o sinus infection  Review of Systems  Constitutional: Negative for fever.  HENT: Negative for ear pain.   Eyes: Negative for discharge.  Respiratory: Negative for cough.   Cardiovascular: Negative for chest pain.  Gastrointestinal: Negative for abdominal distention.  Endocrine: Negative for polyuria.  Genitourinary: Negative for difficulty urinating.  Musculoskeletal: Negative for gait problem and neck pain.  Skin: Negative for color change and rash.  Neurological: Negative for speech difficulty and headaches.  Psychiatric/Behavioral: Negative for agitation.       Objective:    BP 115/71 mmHg  Pulse 88  Temp(Src) 98 F (36.7 C) (Oral)  Ht 6' (1.829 m)  Wt 204 lb 6.4 oz (92.715 kg)  BMI 27.72 kg/m2 Physical Exam        Assessment & Plan:     ICD-9-CM ICD-10-CM   1. Subacute maxillary sinusitis 461.0 J01.00 levofloxacin (LEVAQUIN) 500 MG tablet     methylPREDNIsolone (MEDROL DOSPACK) 4 MG tablet   Push po fluids, rest, tylenol and motrin otc prn as directed for fever, arthralgias, and myalgias.  Follow up prn if sx's continue or persist.  No Follow-up on file.  Deatra CanterWilliam J Sneha Willig FNP

## 2015-04-01 ENCOUNTER — Ambulatory Visit (INDEPENDENT_AMBULATORY_CARE_PROVIDER_SITE_OTHER): Payer: Federal, State, Local not specified - PPO | Admitting: Family Medicine

## 2015-04-01 ENCOUNTER — Encounter: Payer: Self-pay | Admitting: Family Medicine

## 2015-04-01 VITALS — BP 102/66 | HR 70 | Temp 97.1°F | Ht 72.0 in | Wt 213.0 lb

## 2015-04-01 DIAGNOSIS — J0101 Acute recurrent maxillary sinusitis: Secondary | ICD-10-CM | POA: Diagnosis not present

## 2015-04-01 DIAGNOSIS — J029 Acute pharyngitis, unspecified: Secondary | ICD-10-CM | POA: Diagnosis not present

## 2015-04-01 LAB — POCT RAPID STREP A (OFFICE): RAPID STREP A SCREEN: NEGATIVE

## 2015-04-01 MED ORDER — BETAMETHASONE SOD PHOS & ACET 6 (3-3) MG/ML IJ SUSP
6.0000 mg | Freq: Once | INTRAMUSCULAR | Status: AC
  Administered 2015-04-01: 6 mg via INTRAMUSCULAR

## 2015-04-01 MED ORDER — LEVOFLOXACIN 500 MG PO TABS: 500.0000 mg | ORAL_TABLET | Freq: Every day | ORAL | Status: DC

## 2015-04-01 NOTE — Addendum Note (Signed)
Addended by: Mechele Claude on: 04/01/2015 01:15 PM   Modules accepted: Kipp Brood

## 2015-04-01 NOTE — Progress Notes (Signed)
Subjective:  Patient ID: Andrew Villegas, male    DOB: 1961-06-03  Age: 54 y.o. MRN: [id number]  CC: Sore Throat and Otalgia   HPI Andrew Villegas presents for swelling at left jawline. Left ear pain. Last night felt like throat was closing up. Having cold chills. Onset 2 nights ago. HX sinus surgery, "went in and opened it up and straightened the septum" in 1993. Not coughing this time.  History Andrew Villegas has a past medical history of Iron deficiency anemia; Hemorrhoid; Chest pain; Shortness of breath; Arthritis; Sleep apnea; and Leukopenia (09/27/2013).   He has past surgical history that includes Tonsillectomy; Rotator cuff repair (Left, 2002 OR 2003); RECTAL POLYPS REMOVED; Nasal sinus surgery; and Hemorrhoid surgery (N/A, 11/27/2012).   His family history includes Alzheimer's disease in his father; Cancer in his mother.He reports that he has never smoked. He has never used smokeless tobacco. He reports that he drinks about 0.6 oz of alcohol per week. He reports that he does not use illicit drugs.  Outpatient Prescriptions Prior to Visit  Medication Sig Dispense Refill  . acetaminophen (TYLENOL) 500 MG tablet Take 500 mg by mouth every 6 (six) hours as needed for pain.    Marland Kitchen docusate sodium (COLACE) 100 MG capsule Take 100 mg by mouth as needed for mild constipation.    . fluticasone (FLONASE) 50 MCG/ACT nasal spray Place 1 spray into the nose daily. 16 g 6  . loratadine (CLARITIN) 10 MG tablet Take 10 mg by mouth daily.    Marland Kitchen levofloxacin (LEVAQUIN) 500 MG tablet Take 1 tablet (500 mg total) by mouth daily. (Patient not taking: Reported on 04/01/2015) 14 tablet 0  . methylPREDNIsolone (MEDROL DOSPACK) 4 MG tablet follow package directions (Patient not taking: Reported on 04/01/2015) 21 tablet 0   No facility-administered medications prior to visit.    ROS Review of Systems  Constitutional: Negative for fever, chills, activity change and appetite change.  HENT: Positive for congestion,  postnasal drip, rhinorrhea and sinus pressure. Negative for ear discharge, ear pain, hearing loss, nosebleeds, sneezing and trouble swallowing.   Respiratory: Positive for stridor. Negative for chest tightness and shortness of breath.   Cardiovascular: Negative for chest pain and palpitations.  Skin: Negative for rash.    Objective:  BP 102/66 mmHg  Pulse 70  Temp(Src) 97.1 F (36.2 C) (Oral)  Ht 6' (1.829 m)  Wt 213 lb (96.616 kg)  BMI 28.88 kg/m2  BP Readings from Last 3 Encounters:  04/01/15 102/66  08/30/14 115/71  08/06/14 124/83    Wt Readings from Last 3 Encounters:  04/01/15 213 lb (96.616 kg)  08/30/14 204 lb 6.4 oz (92.715 kg)  08/06/14 208 lb (94.348 kg)     Physical Exam  Constitutional: He appears well-developed and well-nourished.  HENT:  Head: Normocephalic and atraumatic.  Right Ear: Tympanic membrane and external ear normal. No decreased hearing is noted.  Left Ear: External ear normal. A middle ear effusion is present. No decreased hearing is noted.  Nose: Mucosal edema present. No sinus tenderness or nasal deformity. Right sinus exhibits no frontal sinus tenderness. Left sinus exhibits maxillary sinus tenderness. Left sinus exhibits no frontal sinus tenderness.  Mouth/Throat: No oropharyngeal exudate or posterior oropharyngeal erythema.  Neck: No Brudzinski's sign noted.  Pulmonary/Chest: Breath sounds normal. No respiratory distress.  Lymphadenopathy:       Head (right side): No preauricular adenopathy present.       Head (left side): No preauricular adenopathy present.  Right cervical: No superficial cervical adenopathy present.      Left cervical: No superficial cervical adenopathy present.    No results found for: HGBA1C  Lab Results  Component Value Date   WBC 4.2* 03/22/2014   HGB 15.9 03/22/2014   HCT 47.0 03/22/2014   PLT 144 09/20/2013   GLUCOSE 86 04/04/2014   CHOL 227* 03/22/2014   TRIG 143 03/22/2014   HDL 45 03/22/2014    LDLCALC 153* 03/22/2014   ALT 27 04/04/2014   AST 22 04/04/2014   NA 140 04/04/2014   K 3.9 04/04/2014   CL 101 04/04/2014   CREATININE 1.11 04/04/2014   BUN 16 04/04/2014   CO2 23 04/04/2014   TSH 2.020 04/04/2014   PSA 0.6 04/04/2014   INR 1.0 10/02/2012    No results found.  Assessment & Plan:   Andrew Villegas was seen today for sore throat and otalgia.  Diagnoses and all orders for this visit:  Sore throat -     POCT rapid strep A -     CBC with Differential/Platelet  Acute recurrent maxillary sinusitis -     betamethasone acetate-betamethasone sodium phosphate (CELESTONE) injection 6 mg; Inject 1 mL (6 mg total) into the muscle once.  Other orders -     levofloxacin (LEVAQUIN) 500 MG tablet; Take 1 tablet (500 mg total) by mouth daily.   I have discontinued Andrew Villegas levofloxacin and methylPREDNIsolone. I am also having him start on levofloxacin. Additionally, I am having him maintain his loratadine, acetaminophen, fluticasone, and docusate sodium. We administered betamethasone acetate-betamethasone sodium phosphate.  Meds ordered this encounter  Medications  . betamethasone acetate-betamethasone sodium phosphate (CELESTONE) injection 6 mg    Sig:   . levofloxacin (LEVAQUIN) 500 MG tablet    Sig: Take 1 tablet (500 mg total) by mouth daily.    Dispense:  10 tablet    Refill:  0     Follow-up: Return if symptoms worsen or fail to improve.  Mechele Claude, M.D.

## 2015-08-13 ENCOUNTER — Ambulatory Visit (INDEPENDENT_AMBULATORY_CARE_PROVIDER_SITE_OTHER): Payer: Federal, State, Local not specified - PPO | Admitting: Family Medicine

## 2015-08-13 ENCOUNTER — Encounter: Payer: Self-pay | Admitting: Family Medicine

## 2015-08-13 VITALS — BP 105/72 | HR 84 | Temp 97.1°F | Ht 72.0 in | Wt 211.2 lb

## 2015-08-13 DIAGNOSIS — J029 Acute pharyngitis, unspecified: Secondary | ICD-10-CM

## 2015-08-13 DIAGNOSIS — R05 Cough: Secondary | ICD-10-CM | POA: Diagnosis not present

## 2015-08-13 DIAGNOSIS — J329 Chronic sinusitis, unspecified: Secondary | ICD-10-CM | POA: Diagnosis not present

## 2015-08-13 DIAGNOSIS — J40 Bronchitis, not specified as acute or chronic: Secondary | ICD-10-CM | POA: Diagnosis not present

## 2015-08-13 DIAGNOSIS — R509 Fever, unspecified: Secondary | ICD-10-CM

## 2015-08-13 DIAGNOSIS — R059 Cough, unspecified: Secondary | ICD-10-CM

## 2015-08-13 LAB — POCT RAPID STREP A (OFFICE): Rapid Strep A Screen: NEGATIVE

## 2015-08-13 LAB — POCT INFLUENZA A/B
INFLUENZA B, POC: NEGATIVE
Influenza A, POC: NEGATIVE

## 2015-08-13 MED ORDER — LEVOFLOXACIN 500 MG PO TABS: 500.0000 mg | ORAL_TABLET | Freq: Every day | ORAL | Status: DC

## 2015-08-13 MED ORDER — HYDROCODONE-HOMATROPINE 5-1.5 MG/5ML PO SYRP: 5.0000 mL | ORAL_SOLUTION | Freq: Four times a day (QID) | ORAL | Status: DC | PRN

## 2015-08-13 MED ORDER — BETAMETHASONE SOD PHOS & ACET 6 (3-3) MG/ML IJ SUSP
6.0000 mg | Freq: Once | INTRAMUSCULAR | Status: AC
  Administered 2015-08-13: 6 mg via INTRAMUSCULAR

## 2015-08-13 NOTE — Progress Notes (Signed)
Subjective:  Patient ID: Andrew Villegas, male    DOB: 02-28-1961  Age: 55 y.o. MRN: [id number]  CC: URI   HPI Andrew Villegas presents for Patient presents with upper respiratory congestion. Rhinorrhea that is frequently purulent. There is moderate sore throat. Patient reports coughing frequently as well.yellow-colored/purulent sputum noted. There is subj. fever with chills and sweats. The patient denies being short of breath. Onset was 3-5 days ago. Gradually worsening in spite of home remedies.    History Andrew Villegas has a past medical history of Iron deficiency anemia; Hemorrhoid; Chest pain; Shortness of breath; Arthritis; Sleep apnea; and Leukopenia (09/27/2013).   He has past surgical history that includes Tonsillectomy; Rotator cuff repair (Left, 2002 OR 2003); RECTAL POLYPS REMOVED; Nasal sinus surgery; and Hemorrhoid surgery (N/A, 11/27/2012).   His family history includes Alzheimer's disease in his father; Cancer in his mother.He reports that he has never smoked. He has never used smokeless tobacco. He reports that he drinks about 0.6 oz of alcohol per week. He reports that he does not use illicit drugs.    ROS Review of Systems  Constitutional: Negative for fever, chills, activity change and appetite change.  HENT: Positive for congestion, postnasal drip, rhinorrhea and sinus pressure. Negative for ear discharge, ear pain, hearing loss, nosebleeds, sneezing and trouble swallowing.   Respiratory: Negative for chest tightness and shortness of breath.   Cardiovascular: Negative for chest pain and palpitations.  Skin: Negative for rash.    Objective:  BP 105/72 mmHg  Pulse 84  Temp(Src) 97.1 F (36.2 C) (Oral)  Ht 6' (1.829 m)  Wt 211 lb 3.2 oz (95.8 kg)  BMI 28.64 kg/m2  SpO2 97%  BP Readings from Last 3 Encounters:  08/13/15 105/72  04/01/15 102/66  08/30/14 115/71    Wt Readings from Last 3 Encounters:  08/13/15 211 lb 3.2 oz (95.8 kg)  04/01/15 213 lb (96.616 kg)    08/30/14 204 lb 6.4 oz (92.715 kg)     Physical Exam  Constitutional: He appears well-developed and well-nourished.  HENT:  Head: Normocephalic and atraumatic.  Right Ear: Tympanic membrane and external ear normal. No decreased hearing is noted.  Left Ear: Tympanic membrane and external ear normal. No decreased hearing is noted.  Nose: Mucosal edema present. Right sinus exhibits no frontal sinus tenderness. Left sinus exhibits no frontal sinus tenderness.  Mouth/Throat: No oropharyngeal exudate or posterior oropharyngeal erythema.  Neck: No Brudzinski's sign noted.  Pulmonary/Chest: Breath sounds normal. No respiratory distress.  Lymphadenopathy:       Head (right side): No preauricular adenopathy present.       Head (left side): No preauricular adenopathy present.       Right cervical: No superficial cervical adenopathy present.      Left cervical: No superficial cervical adenopathy present.     Lab Results  Component Value Date   WBC 4.2* 03/22/2014   HGB 15.9 03/22/2014   HCT 47.0 03/22/2014   PLT 144 09/20/2013   GLUCOSE 86 04/04/2014   CHOL 227* 03/22/2014   TRIG 143 03/22/2014   HDL 45 03/22/2014   LDLCALC 153* 03/22/2014   ALT 27 04/04/2014   AST 22 04/04/2014   NA 140 04/04/2014   K 3.9 04/04/2014   CL 101 04/04/2014   CREATININE 1.11 04/04/2014   BUN 16 04/04/2014   CO2 23 04/04/2014   TSH 2.020 04/04/2014   PSA 0.6 04/04/2014   INR 1.0 10/02/2012   Results for orders placed or performed in  visit on 08/13/15  POCT Influenza A/B  Result Value Ref Range   Influenza A, POC Negative Negative   Influenza B, POC Negative Negative  POCT rapid strep A  Result Value Ref Range   Rapid Strep A Screen Negative Negative     Assessment & Plan:   Andrew Villegas was seen today for uri.  Diagnoses and all orders for this visit:  Sore throat -     POCT Influenza A/B -     POCT rapid strep A -     betamethasone acetate-betamethasone sodium phosphate (CELESTONE)  injection 6 mg; Inject 1 mL (6 mg total) into the muscle once.  Other specified fever -     POCT Influenza A/B -     POCT rapid strep A -     betamethasone acetate-betamethasone sodium phosphate (CELESTONE) injection 6 mg; Inject 1 mL (6 mg total) into the muscle once.  Cough -     POCT Influenza A/B -     POCT rapid strep A -     betamethasone acetate-betamethasone sodium phosphate (CELESTONE) injection 6 mg; Inject 1 mL (6 mg total) into the muscle once.  Sinobronchitis -     betamethasone acetate-betamethasone sodium phosphate (CELESTONE) injection 6 mg; Inject 1 mL (6 mg total) into the muscle once.  Other orders -     levofloxacin (LEVAQUIN) 500 MG tablet; Take 1 tablet (500 mg total) by mouth daily. -     HYDROcodone-homatropine (HYCODAN) 5-1.5 MG/5ML syrup; Take 5 mLs by mouth every 6 (six) hours as needed for cough.      I am having Andrew Villegas start on HYDROcodone-homatropine. I am also having him maintain his loratadine, acetaminophen, fluticasone, docusate sodium, and levofloxacin. We administered betamethasone acetate-betamethasone sodium phosphate.  Meds ordered this encounter  Medications  . levofloxacin (LEVAQUIN) 500 MG tablet    Sig: Take 1 tablet (500 mg total) by mouth daily.    Dispense:  10 tablet    Refill:  0  . HYDROcodone-homatropine (HYCODAN) 5-1.5 MG/5ML syrup    Sig: Take 5 mLs by mouth every 6 (six) hours as needed for cough.    Dispense:  120 mL    Refill:  0  . betamethasone acetate-betamethasone sodium phosphate (CELESTONE) injection 6 mg    Sig:      Follow-up: Return if symptoms worsen or fail to improve.  Mechele Claude, M.D.

## 2016-05-11 ENCOUNTER — Ambulatory Visit (INDEPENDENT_AMBULATORY_CARE_PROVIDER_SITE_OTHER): Payer: Federal, State, Local not specified - PPO | Admitting: Family Medicine

## 2016-05-11 ENCOUNTER — Emergency Department (HOSPITAL_COMMUNITY): Payer: Federal, State, Local not specified - PPO

## 2016-05-11 ENCOUNTER — Emergency Department (HOSPITAL_COMMUNITY)
Admission: EM | Admit: 2016-05-11 | Discharge: 2016-05-11 | Disposition: A | Discharge status: Home / Self Care | Payer: Federal, State, Local not specified - PPO | Attending: Emergency Medicine | Admitting: Emergency Medicine

## 2016-05-11 ENCOUNTER — Encounter (HOSPITAL_COMMUNITY): Payer: Self-pay | Admitting: Emergency Medicine

## 2016-05-11 ENCOUNTER — Encounter: Payer: Self-pay | Admitting: Family Medicine

## 2016-05-11 VITALS — BP 122/81 | HR 81 | Temp 97.9°F | Ht 72.0 in | Wt 218.0 lb

## 2016-05-11 DIAGNOSIS — N1330 Unspecified hydronephrosis: Secondary | ICD-10-CM

## 2016-05-11 DIAGNOSIS — N200 Calculus of kidney: Secondary | ICD-10-CM

## 2016-05-11 DIAGNOSIS — R109 Unspecified abdominal pain: Secondary | ICD-10-CM | POA: Diagnosis not present

## 2016-05-11 DIAGNOSIS — R3129 Other microscopic hematuria: Secondary | ICD-10-CM | POA: Diagnosis not present

## 2016-05-11 DIAGNOSIS — Z7982 Long term (current) use of aspirin: Secondary | ICD-10-CM | POA: Insufficient documentation

## 2016-05-11 DIAGNOSIS — N23 Unspecified renal colic: Secondary | ICD-10-CM | POA: Diagnosis not present

## 2016-05-11 DIAGNOSIS — Z79899 Other long term (current) drug therapy: Secondary | ICD-10-CM | POA: Insufficient documentation

## 2016-05-11 DIAGNOSIS — N132 Hydronephrosis with renal and ureteral calculous obstruction: Secondary | ICD-10-CM | POA: Diagnosis not present

## 2016-05-11 LAB — URINALYSIS
BILIRUBIN UA: NEGATIVE
GLUCOSE, UA: NEGATIVE
Ketones, UA: NEGATIVE
Leukocytes, UA: NEGATIVE
Nitrite, UA: NEGATIVE
PH UA: 5.5 (ref 5.0–7.5)
PROTEIN UA: NEGATIVE
SPEC GRAV UA: 1.02 (ref 1.005–1.030)
Urobilinogen, Ur: 0.2 mg/dL (ref 0.2–1.0)

## 2016-05-11 LAB — URINALYSIS, ROUTINE W REFLEX MICROSCOPIC
BILIRUBIN URINE: NEGATIVE
GLUCOSE, UA: NEGATIVE mg/dL
KETONES UR: NEGATIVE mg/dL
LEUKOCYTES UA: NEGATIVE
Nitrite: NEGATIVE
PROTEIN: NEGATIVE mg/dL
Specific Gravity, Urine: 1.022 (ref 1.005–1.030)
pH: 5.5 (ref 5.0–8.0)

## 2016-05-11 LAB — CBC
HEMATOCRIT: 44.8 % (ref 39.0–52.0)
Hemoglobin: 15.4 g/dL (ref 13.0–17.0)
MCH: 30.6 pg (ref 26.0–34.0)
MCHC: 34.4 g/dL (ref 30.0–36.0)
MCV: 88.9 fL (ref 78.0–100.0)
Platelets: 167 10*3/uL (ref 150–400)
RBC: 5.04 MIL/uL (ref 4.22–5.81)
RDW: 12.6 % (ref 11.5–15.5)
WBC: 11 10*3/uL — ABNORMAL HIGH (ref 4.0–10.5)

## 2016-05-11 LAB — URINE MICROSCOPIC-ADD ON

## 2016-05-11 LAB — BASIC METABOLIC PANEL
ANION GAP: 7 (ref 5–15)
BUN: 16 mg/dL (ref 6–20)
CALCIUM: 9.4 mg/dL (ref 8.9–10.3)
CO2: 25 mmol/L (ref 22–32)
Chloride: 105 mmol/L (ref 101–111)
Creatinine, Ser: 1.27 mg/dL — ABNORMAL HIGH (ref 0.61–1.24)
GFR calc Af Amer: >60 mL/min (ref >60)
GFR calc non Af Amer: >60 mL/min (ref >60)
GLUCOSE: 131 mg/dL — AB (ref 65–99)
Potassium: 3.7 mmol/L (ref 3.5–5.1)
Sodium: 137 mmol/L (ref 135–145)

## 2016-05-11 LAB — POC OCCULT BLOOD, ED: Fecal Occult Bld: NEGATIVE

## 2016-05-11 MED ORDER — MORPHINE SULFATE (PF) 4 MG/ML IV SOLN
4.0000 mg | Freq: Once | INTRAVENOUS | Status: AC
  Administered 2016-05-11: 4 mg via INTRAVENOUS
  Filled 2016-05-11: qty 1

## 2016-05-11 MED ORDER — SODIUM CHLORIDE 0.9 % IV BOLUS (SEPSIS)
1000.0000 mL | Freq: Once | INTRAVENOUS | Status: AC
  Administered 2016-05-11: 1000 mL via INTRAVENOUS

## 2016-05-11 MED ORDER — ONDANSETRON HCL 4 MG/2ML IJ SOLN
4.0000 mg | Freq: Once | INTRAMUSCULAR | Status: AC
  Administered 2016-05-11: 4 mg via INTRAVENOUS
  Filled 2016-05-11: qty 2

## 2016-05-11 MED ORDER — OXYCODONE-ACETAMINOPHEN 5-325 MG PO TABS: 1.0000 | ORAL_TABLET | ORAL | 0 refills | Status: DC | PRN

## 2016-05-11 NOTE — ED Provider Notes (Signed)
[facility]-EMERGENCY DEPT Provider Note   CSN: [id number] Arrival date & time: 05/11/16  0327  History   Chief Complaint Chief Complaint  Patient presents with  . Flank Pain    HPI Andrew Villegas is a 55 y.o. male.  HPI  55 y.o. male with a hx of Iron Deficiency Anemia, presents to the Emergency Department today complaining of right sided flank pain since 1 AM this AM. No hx stones. States pain occurred all of a sudden. Notes sharp sensation that radiates into groin. Pain 9/10. Has not taken any OTC relief. Notes nausea, but no emesis. No diarrhea. Pt states that occurred while urinating and trying to have BM. Last BM yesterday. Pt states currently not passing any flatus. No hx SBO. No recent surgeries. Does note black stools, but states that he is on iron supplements and this is normal. No fevers at home. No CP/SOB. No other symptoms noted.   Past Medical History:  Diagnosis Date  . Arthritis    ALL JOINTS HURT  . Chest pain    HAS CHEST PAINS THAT PT ATTRIBUTES TO HIS ANEMIA --STATES PAST HX OF ABNORMAL EKGS BUT NEGATIVE STRESS TESTS.  Marland Kitchen Hemorrhoid    BLEEDING FROM HEMORRHOIDS FOR YRS  . Iron deficiency anemia   . Leukopenia 09/27/2013  . Shortness of breath    ATTRIBUTES TO THE POLLEN AND TO HIS ANEMIA  . Sleep apnea    PT STATES TOLD MILD - DID NOT NEED CPAP    Patient Active Problem List   Diagnosis Date Noted  . Sinobronchitis 08/13/2015  . Leukopenia 09/27/2013    Past Surgical History:  Procedure Laterality Date  . HEMORRHOID SURGERY N/A 11/27/2012   Procedure: HEMORRHOIDECTOMY;  Surgeon: Shelly Rubenstein, MD;  Location: [facility] ORS;  Service: General;  Laterality: N/A;  . NASAL SINUS SURGERY    . RECTAL POLYPS REMOVED    . ROTATOR CUFF REPAIR Left 2002 OR 2003   STILL HAS SOME PAIN IN SHOULDER  . TONSILLECTOMY         Home Medications    Prior to Admission medications   Medication Sig Start Date End Date Taking? Authorizing Provider  acetaminophen (TYLENOL) 500  MG tablet Take 500 mg by mouth every 6 (six) hours as needed for pain.    Historical Provider, MD  docusate sodium (COLACE) 100 MG capsule Take 100 mg by mouth as needed for mild constipation.    Historical Provider, MD  fluticasone (FLONASE) 50 MCG/ACT nasal spray Place 1 spray into the nose daily. 11/23/12   Coralie Keens, FNP  HYDROcodone-homatropine [facility]) 5-1.5 MG/5ML syrup Take 5 mLs by mouth every 6 (six) hours as needed for cough. 08/13/15   Mechele Claude, MD  levofloxacin (LEVAQUIN) 500 MG tablet Take 1 tablet (500 mg total) by mouth daily. 08/13/15   Mechele Claude, MD  loratadine (CLARITIN) 10 MG tablet Take 10 mg by mouth daily.    Historical Provider, MD    Family History Family History  Problem Relation Age of Onset  . Cancer Mother     brain tumors  . Alzheimer's disease Father     Social History Social History  Substance Use Topics  . Smoking status: Never Smoker  . Smokeless tobacco: Never Used  . Alcohol use Yes     Allergies   Penicillins   Review of Systems Review of Systems ROS reviewed and all are negative for acute change except as noted in the HPI.  Physical Exam Updated Vital Signs  BP 133/88 (BP Location: Right Arm)   Pulse 79   Temp 97.8 F (36.6 C) (Oral)   Resp 20   Ht 6' (1.829 m)   Wt 86.2 kg   SpO2 99%   BMI 25.77 kg/m   Physical Exam  Constitutional: He is oriented to person, place, and time. Vital signs are normal. He appears well-developed and well-nourished.  HENT:  Head: Normocephalic and atraumatic.  Right Ear: Hearing normal.  Left Ear: Hearing normal.  Eyes: Conjunctivae and EOM are normal. Pupils are equal, round, and reactive to light.  Neck: Normal range of motion. Neck supple.  Cardiovascular: Normal rate, regular rhythm, normal heart sounds and intact distal pulses.   Pulmonary/Chest: Effort normal and breath sounds normal. No respiratory distress. He has no wheezes.  Abdominal: Soft. Normal appearance and bowel  sounds are normal. He exhibits distension. There is CVA tenderness (right side). There is no rigidity, no rebound, no guarding, no tenderness at McBurney's point and negative Murphy's sign.  Neurological: He is alert and oriented to person, place, and time.  Skin: Skin is warm and dry.  Psychiatric: He has a normal mood and affect. His speech is normal and behavior is normal. Thought content normal.  Nursing note and vitals reviewed.  ED Treatments / Results  Labs (all labs ordered are listed, but only abnormal results are displayed) Labs Reviewed  URINALYSIS, ROUTINE W REFLEX MICROSCOPIC (NOT AT [facility]) - Abnormal; Notable for the following:       Result Value   Hgb urine dipstick LARGE (*)    All other components within normal limits  URINE MICROSCOPIC-ADD ON - Abnormal; Notable for the following:    Squamous Epithelial / LPF 0-5 (*)    Bacteria, UA FEW (*)    All other components within normal limits  CBC  BASIC METABOLIC PANEL  POC OCCULT BLOOD, ED   EKG  EKG Interpretation None      Radiology No results found.  Procedures Procedures (including critical care time)  Medications Ordered in ED Medications  sodium chloride 0.9 % bolus 1,000 mL (not administered)  morphine 4 MG/ML injection 4 mg (not administered)  ondansetron (ZOFRAN) injection 4 mg (not administered)   Initial Impression / Assessment and Plan / ED Course  I have reviewed the triage vital signs and the nursing notes.  Pertinent labs & imaging results that were available during my care of the patient were reviewed by me and considered in my medical decision making (see chart for details).  Clinical Course    Final Clinical Impressions(s) / ED Diagnoses  I have reviewed and evaluated the relevant laboratory values I have reviewed and evaluated the relevant imaging studies.  I have reviewed the relevant previous healthcare records.I obtained HPI from historian.  ED Course:  Assessment: Pt is a 55yM  with hx Iron Deficiency Anemia who presents with Right flank pain with sudden onset 1AM. NO hx stones. On exam, pt in NAD. Nontoxic/nonseptic appearing. VSS. Afebrile. Lungs CTA. Heart RRR. Abdomen with slight distention, but soft. Right CVA noted. UA shows Hgb. No infection. CBC with hgb 15. BMP unremarkable. Given fluids, analgesia, and antiemetics. CT Renal showed 3mm renal calculi that was recently passed or at [facility]. Pt noted black stool, but states he is on iron supplements. Occult stool card negative. Plan is to DC home with Percocet #7.I have reviewed the [place] Controlled Substance Reporting System. At time of discharge, Patient is in no acute distress. Vital Signs are stable. Patient  is able to ambulate. Patient able to tolerate PO.    Disposition/Plan:  DC Home Additional Verbal discharge instructions given and discussed with patient.  Pt Instructed to f/u with PCP in the next week for evaluation and treatment of symptoms. Return precautions given Pt acknowledges and agrees with plan  Supervising Physician Shon Batonourtney F Horton, MD   Final diagnoses:  Nephrolithiasis    New Prescriptions New Prescriptions   No medications on file     Audry Piliyler Bianco Cange, PA-C 05/11/16 [id number]    Shon Batonourtney F Horton, MD 05/12/16 [phone]

## 2016-05-11 NOTE — Progress Notes (Signed)
Subjective:  Patient ID: Andrew Villegas, male    DOB: 07/12/1961  Age: 55 y.o. MRN: [id number]  CC: Hospitalization Follow-up (pt here today following up after being seen in ER last night for Kidney Stone which he passed last night before leaving)   HPI Andrew Butcheravid B Hsiung presents for Passed a kidney stone early this morning the emergency department. He woke up at 1 AM and went to the bathroom. He sat down in the living room of his home and self to a sudden stabbing pain in his back that radiated from the right flank around across the mid abdomen to the left side of the abdomen. He thought he had appendicitis first.   He had a CT scan showing the stone being at the left UVJ. or in the bladder itself. Patient says he felt the stone pass but it was not caught in a strainer. CT noted that there were no other stones in the kidney. He took 1 oxycodone and has been resting all day. Currently he is in out of concern for recurrence of the pain through more stones.   History Andrew Villegas has a past medical history of Arthritis; Chest pain; Hemorrhoid; Iron deficiency anemia; Leukopenia (09/27/2013); Shortness of breath; and Sleep apnea.   He has a past surgical history that includes Tonsillectomy; Rotator cuff repair (Left, 2002 OR 2003); RECTAL POLYPS REMOVED; Nasal sinus surgery; and Hemorrhoid surgery (N/A, 11/27/2012).   His family history includes Alzheimer's disease in his father; Cancer in his mother.He reports that he has never smoked. He has never used smokeless tobacco. He reports that he drinks alcohol. He reports that he does not use drugs.    ROS Review of Systems  Constitutional: Negative for chills, diaphoresis and fever.  HENT: Negative for rhinorrhea and sore throat.   Respiratory: Negative for cough and shortness of breath.   Cardiovascular: Negative for chest pain.  Gastrointestinal: Positive for abdominal pain.  Genitourinary: Positive for flank pain and penile pain (this a.m.). Negative for  penile swelling, scrotal swelling and testicular pain.  Musculoskeletal: Negative for arthralgias and myalgias.  Skin: Negative for rash.  Neurological: Negative for weakness and headaches.    Objective:  BP 122/81   Pulse 81   Temp 97.9 F (36.6 C) (Oral)   Ht 6' (1.829 m)   Wt 218 lb (98.9 kg)   BMI 29.57 kg/m   BP Readings from Last 3 Encounters:  05/11/16 122/81  05/11/16 119/73  08/13/15 105/72    Wt Readings from Last 3 Encounters:  05/11/16 218 lb (98.9 kg)  05/11/16 190 lb (86.2 kg)  08/13/15 211 lb 3.2 oz (95.8 kg)     Physical Exam  Constitutional: He is oriented to person, place, and time. He appears well-developed and well-nourished. No distress.  HENT:  Head: Normocephalic and atraumatic.  Right Ear: External ear normal.  Left Ear: External ear normal.  Nose: Nose normal.  Mouth/Throat: Oropharynx is clear and moist.  Eyes: Conjunctivae and EOM are normal. Pupils are equal, round, and reactive to light.  Neck: Normal range of motion. Neck supple. No thyromegaly present.  Cardiovascular: Normal rate, regular rhythm and normal heart sounds.   No murmur heard. Pulmonary/Chest: Effort normal and breath sounds normal. No respiratory distress. He has no wheezes. He has no rales.  Abdominal: Soft. Bowel sounds are normal. He exhibits no distension. There is no tenderness.  Musculoskeletal: He exhibits tenderness (minimal for R falnk percussion).  Lymphadenopathy:    He has no cervical  adenopathy.  Neurological: He is alert and oriented to person, place, and time. He has normal reflexes.  Skin: Skin is warm and dry.  Psychiatric: He has a normal mood and affect. His behavior is normal. Judgment and thought content normal.     Lab Results  Component Value Date   WBC 11.0 (H) 05/11/2016   HGB 15.4 05/11/2016   HCT 44.8 05/11/2016   PLT 167 05/11/2016   GLUCOSE 131 (H) 05/11/2016   CHOL 227 (H) 03/22/2014   TRIG 143 03/22/2014   HDL 45 03/22/2014    LDLCALC 153 (H) 03/22/2014   ALT 27 04/04/2014   AST 22 04/04/2014   NA 137 05/11/2016   K 3.7 05/11/2016   CL 105 05/11/2016   CREATININE 1.27 (H) 05/11/2016   BUN 16 05/11/2016   CO2 25 05/11/2016   TSH 2.020 04/04/2014   PSA 0.6 04/04/2014   INR 1.0 10/02/2012    Ct Renal Stone Study    Result Date: 05/11/2016 CLINICAL DATA:  55 year old male with right flank pain. EXAM: CT ABDOMEN AND PELVIS WITHOUT CONTRAST TECHNIQUE: Multidetector CT imaging of the abdomen and pelvis was performed following the standard protocol without IV contrast. COMPARISON:  CT dated 10/05/2012 FINDINGS: Evaluation of this exam is limited in the absence of intravenous contrast. Lower chest: The visualized lung bases are clear. No intra-abdominal free air or free fluid. Hepatobiliary: The liver is unremarkable. The gallbladder is unremarkable as well. No biliary ductal dilatation. Pancreas: Unremarkable. No pancreatic ductal dilatation or surrounding inflammatory changes. Spleen: Normal in size without focal abnormality. Adrenals/Urinary Tract: The adrenal glands are unremarkable. There is a 3 mm stone within the urinary bladder adjacent to the right UVJ which may represent a recently passed right renal calculus versus a right UVJ stone. There is mild right hydronephrosis. No other renal calculi identified. The left kidney is unremarkable. The urinary bladder is predominantly collapsed. Stomach/Bowel: There is no evidence of bowel obstruction or active inflammation. Normal appendix. Vascular/Lymphatic: The abdominal aorta and IVC appear unremarkable on this noncontrast study. No portal venous gas identified. There is no adenopathy. Reproductive: The prostate and seminal vesicles are grossly unremarkable. Other: There is mild haziness of the mesentery with multiple top-normal lymph nodes with a "misty mesentery" appearance. This finding is nonspecific but may be related to underlying inflammatory/infectious etiology.  Musculoskeletal: No acute or significant osseous findings. IMPRESSION: A recently passed 3 mm right renal calculus versus a right UVJ stone with mild right hydronephrosis. Electronically Signed   By: Elgie Collard M.D.   On: 05/11/2016 05:14    Assessment & Plan:   Emery was seen today for hospitalization follow-up.  Diagnoses and all orders for this visit:  Renal colic on right side -     Urinalysis -     Urine culture -     CT RENAL STONE STUDY; Future -     Urinalysis, Complete; Future  Hydronephrosis, unspecified hydronephrosis type -     CT RENAL STONE STUDY; Future -     Urinalysis, Complete; Future  Other microscopic hematuria -     CT RENAL STONE STUDY; Future -     Urinalysis, Complete; Future    His urinalysis shows some blood. His CT urogram showed some hydronephrosis. However the stone has passed. Recommended that rather than seeing urology since there were no further stones, we should repeat his CT urogram to make sure the hydronephrosis clears. He also needs a follow-up on the urine specimen to make sure  the blood clears. If these do not clear and then urology referral would be appropriate If these clear he should pursue acidifying his urine with lemonade and other citrus. If he should have further stones we can add HCTZ or Urocit-K. At that time he could also be referred. Patient is in agreement with this plan.     Follow-up: Return in about 6 weeks (around 06/22/2016).  Mechele Claude, M.D.

## 2016-05-11 NOTE — ED Triage Notes (Signed)
Pt is c/o right flank pain that started at 1am  Pt states the pain radiates around to the front  Pt has nausea and is actively heaving in triage  Pt is pale, skin warm and dry

## 2016-05-11 NOTE — Discharge Instructions (Signed)
Please read and follow all provided instructions.  Your diagnoses today include:  1. Nephrolithiasis     Tests performed today include: Urine test that showed blood in your urine and no infection CT scan which showed a 3 millimeter kidney on the right side Blood test that showed normal kidney function Vital signs. See below for your results today.   Medications prescribed:   Take any prescribed medications only as directed.  Home care instructions:  Follow any educational materials contained in this packet.  Please double your fluid intake for the next several days. Strain your urine and save any stones that may pass.   BE VERY CAREFUL not to take multiple medicines containing Tylenol (also called acetaminophen). Doing so can lead to an overdose which can damage your liver and cause liver failure and possibly death.   Follow-up instructions: Please follow-up with your urologist or the urologist referral (provided on front page) in the next 1 week for further evaluation of your symptoms.  If you need to return to the Emergency Department, [facility] to [facility] and not [facility]. The [facility]rologists are [facility] at [facility] and can better care for you at this location.  Return instructions:  If you need to return to the Emergency Department, go to [facility] and not [facility]. The [facility]rologists are [facility] at [facility] and can better care for you at this location.  Please return to the Emergency Department if you experience worsening symptoms.  Please return if you develop fever or uncontrolled pain or vomiting. Please return if you have any other emergent concerns.  Additional Information:  Your vital signs today were: BP 142/89 (BP Location: Right Arm)    Pulse 70    Temp 97.8 F (36.6 C) (Oral)    Resp 22    Ht 6' (1.829 m)    Wt 86.2 kg    SpO2 98%    BMI 25.77 kg/m  If your blood pressure (BP) was elevated above 135/85 this visit, please have  this repeated by your doctor within one month. --------------

## 2016-05-12 LAB — URINE CULTURE: Organism ID, Bacteria: NO GROWTH

## 2016-05-14 ENCOUNTER — Telehealth: Payer: Self-pay | Admitting: Family Medicine

## 2016-05-17 ENCOUNTER — Other Ambulatory Visit: Payer: Self-pay

## 2016-05-17 ENCOUNTER — Other Ambulatory Visit (INDEPENDENT_AMBULATORY_CARE_PROVIDER_SITE_OTHER): Payer: Federal, State, Local not specified - PPO

## 2016-05-17 ENCOUNTER — Ambulatory Visit (HOSPITAL_COMMUNITY): Payer: Federal, State, Local not specified - PPO

## 2016-05-17 DIAGNOSIS — R3129 Other microscopic hematuria: Secondary | ICD-10-CM

## 2016-05-17 DIAGNOSIS — N23 Unspecified renal colic: Secondary | ICD-10-CM

## 2016-05-17 DIAGNOSIS — N1330 Unspecified hydronephrosis: Secondary | ICD-10-CM

## 2016-05-17 LAB — URINALYSIS, COMPLETE
Bilirubin, UA: NEGATIVE
GLUCOSE, UA: NEGATIVE
Leukocytes, UA: NEGATIVE
NITRITE UA: NEGATIVE
Protein, UA: NEGATIVE
SPEC GRAV UA: 1.025 (ref 1.005–1.030)
UUROB: 0.2 mg/dL (ref 0.2–1.0)
pH, UA: 5 (ref 5.0–7.5)

## 2016-05-17 LAB — MICROSCOPIC EXAMINATION
Bacteria, UA: NONE SEEN
EPITHELIAL CELLS (NON RENAL): NONE SEEN /HPF (ref 0–10)
WBC UA: NONE SEEN /HPF (ref 0–?)

## 2016-05-18 NOTE — Progress Notes (Signed)
Patient aware per Andrew Villegas

## 2016-05-24 ENCOUNTER — Other Ambulatory Visit (INDEPENDENT_AMBULATORY_CARE_PROVIDER_SITE_OTHER): Payer: Federal, State, Local not specified - PPO

## 2016-05-24 DIAGNOSIS — R3129 Other microscopic hematuria: Secondary | ICD-10-CM

## 2016-05-24 LAB — URINALYSIS, COMPLETE
GLUCOSE, UA: NEGATIVE
LEUKOCYTES UA: NEGATIVE
Nitrite, UA: NEGATIVE
Protein, UA: NEGATIVE
SPEC GRAV UA: 1.03 (ref 1.005–1.030)
Urobilinogen, Ur: 0.2 mg/dL (ref 0.2–1.0)
pH, UA: 5 (ref 5.0–7.5)

## 2016-05-24 LAB — MICROSCOPIC EXAMINATION: BACTERIA UA: NONE SEEN

## 2016-07-28 NOTE — Congregational Nurse Program (Signed)
Congregational Nurse Program Note  Date of Encounter: 07/28/2016  Past Medical History: Past Medical History:  Diagnosis Date  . Arthritis    ALL JOINTS HURT  . Chest pain    HAS CHEST PAINS THAT PT ATTRIBUTES TO HIS ANEMIA --STATES PAST HX OF ABNORMAL EKGS BUT NEGATIVE STRESS TESTS.  Marland Kitchen. Hemorrhoid    BLEEDING FROM HEMORRHOIDS FOR YRS  . Iron deficiency anemia   . Leukopenia 09/27/2013  . Shortness of breath    ATTRIBUTES TO THE POLLEN AND TO HIS ANEMIA  . Sleep apnea    PT STATES TOLD MILD - DID NOT NEED CPAP    Encounter Details:     CNP Questionnaire - 07/28/16 2128      Patient Demographics   Is this a new or existing patient? Existing   Patient is considered a/an Not Applicable   Race Caucasian/White     Patient Assistance   Location of Patient Assistance [place]   Patient's financial/insurance status [facility];Private Insurance Coverage   Uninsured Patient ([place] [employer]s) No   Patient referred to apply for the following financial assistance Not Applicable   Food insecurities addressed Not Applicable   Transportation assistance No   Assistance securing medications No   Product/process development scientistducational health offerings Navigating the healthcare system;Nutrition;Safety;Medications  BP 151?84 Pulse 71/Kiauna Zywicki Hart [place], RN      Encounter Details   Primary purpose of visit Education/Health Concerns   Was an Emergency Department visit averted? Not Applicable   Does patient have a medical provider? Yes   Patient referred to Doctor referral for a non-emergent behavioral health crisis   Was a mental health screening completed? (GAINS tool) No   Does patient have dental issues? No   Does patient have vision issues? No   Does your patient have an abnormal blood pressure today? Yes   Since previous encounter, have you referred patient for abnormal blood pressure that resulted in a new diagnosis or medication change? No   Does your patient have an abnormal blood  glucose today? No   Since previous encounter, have you referred patient for abnormal blood glucose that resulted in a new diagnosis or medication change? No   Was there a life-saving intervention made? No     Had elevated B/P 151/84 pulse 71 .  Had ringing in ears, History of Bad sinus infection, advised to see Dr. Benson SettingLeanna Lynnie Koehler,RN  [phone]

## 2016-09-06 DIAGNOSIS — R6883 Chills (without fever): Secondary | ICD-10-CM | POA: Diagnosis not present

## 2016-09-06 DIAGNOSIS — J069 Acute upper respiratory infection, unspecified: Secondary | ICD-10-CM | POA: Diagnosis not present

## 2017-08-04 ENCOUNTER — Encounter: Payer: Self-pay | Admitting: Family Medicine

## 2017-08-04 ENCOUNTER — Ambulatory Visit (HOSPITAL_COMMUNITY)
Admission: RE | Admit: 2017-08-04 | Discharge: 2017-08-04 | Disposition: A | Discharge status: Home / Self Care | Payer: Federal, State, Local not specified - PPO | Source: Ambulatory Visit | Attending: Family Medicine | Admitting: Family Medicine

## 2017-08-04 ENCOUNTER — Ambulatory Visit: Payer: Federal, State, Local not specified - PPO | Admitting: Family Medicine

## 2017-08-04 VITALS — BP 125/83 | HR 100 | Temp 97.5°F | Ht 72.0 in | Wt 211.0 lb

## 2017-08-04 DIAGNOSIS — R5383 Other fatigue: Secondary | ICD-10-CM | POA: Diagnosis not present

## 2017-08-04 DIAGNOSIS — R519 Headache, unspecified: Secondary | ICD-10-CM

## 2017-08-04 DIAGNOSIS — R4789 Other speech disturbances: Secondary | ICD-10-CM

## 2017-08-04 DIAGNOSIS — R531 Weakness: Secondary | ICD-10-CM | POA: Diagnosis not present

## 2017-08-04 DIAGNOSIS — R51 Headache: Secondary | ICD-10-CM | POA: Diagnosis not present

## 2017-08-04 NOTE — Progress Notes (Signed)
Subjective: CC: headache, tinnitus  PCP: Claretta Fraise, MD VZS:MOLMB B Sproule is a 57 y.o. male presenting to clinic today for:  Patient reports he developed a sagittal headache that started last evening.  He notes that this is actually slightly improved since yesterday.  He denies visual disturbance, numbness, tingling, weakness, gait disturbance.  He does report intermittent slurred speech/difficulty forming words that has been ongoing since before Christmas.  No phonophobia or photophobia.  He reports tinnitus bilaterally that has been ongoing for years but may be is slightly worse over the last few weeks.  He notes fatigue and sensation of generalized weakness but has not had any falls or inability to ambulate independently.  He does have a past medical history significant for iron deficiency anemia.  His wife thought that he was looking paler than normal.  For this reason, he started taking 2 tablets of iron about 2 weeks ago.  He notes little improvement in symptoms with iron.  He occasionally takes a baby aspirin if needed for aches or pains.  He denies cough, congestion, rhinorrhea.  No fevers.  No hematochezia, melena, hematuria, hematemesis.  No known source of bleeding.  When he was previously diagnosed with anemia, he had had rectal bleeding.   ROS: Per HPI  Allergies  Allergen Reactions  . Penicillins     Has patient had a PCN reaction causing immediate rash, facial/tongue/throat swelling, SOB or lightheadedness with hypotension: No Has patient had a PCN reaction causing severe rash involving mucus membranes or skin necrosis: No Has patient had a PCN reaction that required hospitalization No Has patient had a PCN reaction occurring within the last 10 years: No If all of the above answers are "NO", then may proceed with Cephalosporin use. Childhood allergy   Past Medical History:  Diagnosis Date  . Arthritis    ALL JOINTS HURT  . Chest pain    HAS CHEST PAINS THAT PT  ATTRIBUTES TO HIS ANEMIA --STATES PAST HX OF ABNORMAL EKGS BUT NEGATIVE STRESS TESTS.  Marland Kitchen Hemorrhoid    BLEEDING FROM HEMORRHOIDS FOR YRS  . Iron deficiency anemia   . Leukopenia 09/27/2013  . Shortness of breath    ATTRIBUTES TO THE POLLEN AND TO HIS ANEMIA  . Sleep apnea    PT STATES TOLD MILD - DID NOT NEED CPAP    Current Outpatient Medications:  .  Ferrous Sulfate (IRON) 325 (65 Fe) MG TABS, Take 1 tablet by mouth daily., Disp: , Rfl:  .  fluticasone (FLONASE) 50 MCG/ACT nasal spray, Place 1 spray into the nose daily., Disp: 16 g, Rfl: 6 .  ibuprofen (ADVIL,MOTRIN) 200 MG tablet, Take 400 mg by mouth every 6 (six) hours as needed for moderate pain., Disp: , Rfl:  .  loratadine (CLARITIN) 10 MG tablet, Take 10 mg by mouth daily., Disp: , Rfl:  Social History   Socioeconomic History  . Marital status: Married    Spouse name: Not on file  . Number of children: Not on file  . Years of education: Not on file  . Highest education level: Not on file  Social Needs  . Financial resource strain: Not on file  . Food insecurity - worry: Not on file  . Food insecurity - inability: Not on file  . Transportation needs - medical: Not on file  . Transportation needs - non-medical: Not on file  Occupational History  . Not on file  Tobacco Use  . Smoking status: Never Smoker  . Smokeless tobacco:  Never Used  Substance and Sexual Activity  . Alcohol use: Yes  . Drug use: No  . Sexual activity: Not on file  Other Topics Concern  . Not on file  Social History Narrative  . Not on file   Family History  Problem Relation Age of Onset  . Cancer Mother        brain tumors  . Alzheimer's disease Father     Objective: Office vital signs reviewed. BP 125/83   Pulse 100   Temp (!) 97.5 F (36.4 C) (Oral)   Ht 6' (1.829 m)   Wt 211 lb (95.7 kg)   BMI 28.62 kg/m   Physical Examination:  General: Awake, alert, well nourished, non toxic appearing, No acute distress HEENT: Normal     Neck: No masses palpated. No lymphadenopathy    Ears: Tympanic membranes intact, normal light reflex, no erythema, no bulging    Eyes: PERRLA, extraocular membranes intact, sclera white; conjunctival pallor appreciated    Nose: nasal turbinates moist, no nasal discharge    Throat: moist mucus membranes, no erythema, no tonsillar exudate.  Airway is patent Cardio: regular rate and rhythm, S1S2 heard, no murmurs appreciated Pulm: clear to auscultation bilaterally, no wheezes, rhonchi or rales; normal work of breathing on room air Extremities: warm, well perfused, No edema, cyanosis or clubbing; + 2 pulses bilaterally MSK: Normal gait and normal station Skin: dry; intact; no rashes or lesions Neuro: 5/5 upper extremity and lower extremity strength.  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  Normal upper and lower extremity cerebellar testing.  Alert and oriented x3.  Assessment/ Plan: 57 y.o. male   I suspect that patient's symptoms are likely related to anemia.  However, he has no known source of bleeding.  Given his difficulty with speech over the last several weeks, I did recommend that we proceed with imaging of the brain.  CT head was ordered without contrast.  I recommended the patient follow-up within the next week or so to discuss with his PCP whether or not MRI brain will be warranted going forward.  Will check CBC, CMP to look for electrolyte abnormalities and evaluate hemoglobin level.  He had a normal neurologic exam today.  Headache is improving, which makes aneurysm or acute CVA less likely.  Recommended that he use Tylenol if needed for headache.  Strict return precautions and reasons for evaluation in the emergency department reviewed with patient and his wife.  They voiced good understanding will follow-up as directed.  1.  Fatigue, unspecified type - CBC with Differential - CMP14+EGFR  2. Feeling weak - CBC with Differential - CMP14+EGFR  3. Acute nonintractable headache, unspecified headache type - CT Head Wo Contrast; Future  4. Word finding difficulty  5. Other speech disturbance - CT Head Wo Contrast; Future   Orders Placed This Encounter  Procedures  . CT Head Wo Contrast    Standing Status:   Future    Standing Expiration Date:   11/03/2018    Order Specific Question:   Preferred imaging location?    Answer:   [facility]    Order Specific Question:   Call Results- Best Contact Number?    Answer:   [phone] not necessary to hold     Order Specific Question:   Radiology Contrast Protocol - do NOT remove file path  Answer:   [email or web address]  . CBC with Differential  . CMP14+EGFR     Janora Norlander, [place] [phone]

## 2017-08-04 NOTE — Patient Instructions (Signed)
You had labs performed today.  You will be contacted with the results of the labs once they are available, usually in the next 3 days for routine lab work.  If your symptoms worsen, please seek immediate medical attention.  Otherwise, will contact you tomorrow with the results of your labs.  I have ordered a CT scan of your head.  Please schedule an appointment with Dr. Livia Snellen to be seen in the next few days for follow-up on your symptoms and continued management.   Anemia Anemia is a condition in which you do not have enough red blood cells or hemoglobin. Hemoglobin is a substance in red blood cells that carries oxygen. When you do not have enough red blood cells or hemoglobin (are anemic), your body cannot get enough oxygen and your organs may not work properly. As a result, you may feel very tired or have other problems. What are the causes? Common causes of anemia include:  Excessive bleeding. Anemia can be caused by excessive bleeding inside or outside the body, including bleeding from the intestine or from periods in women.  Poor nutrition.  Long-lasting (chronic) kidney, thyroid, and liver disease.  Bone marrow disorders.  Cancer and treatments for cancer.  HIV (human immunodeficiency virus) and AIDS (acquired immunodeficiency syndrome).  Treatments for HIV and AIDS.  Spleen problems.  Blood disorders.  Infections, medicines, and autoimmune disorders that destroy red blood cells.  What are the signs or symptoms? Symptoms of this condition include:  Minor weakness.  Dizziness.  Headache.  Feeling heartbeats that are irregular or faster than normal (palpitations).  Shortness of breath, especially with exercise.  Paleness.  Cold sensitivity.  Indigestion.  Nausea.  Difficulty sleeping.  Difficulty concentrating.  Symptoms may occur suddenly or develop slowly. If your anemia is mild, you may not have symptoms. How is this diagnosed? This condition is  diagnosed based on:  Blood tests.  Your medical history.  A physical exam.  Bone marrow biopsy.  Your health care provider may also check your stool (feces) for blood and may do additional testing to look for the cause of your bleeding. You may also have other tests, including:  Imaging tests, such as a CT scan or MRI.  Endoscopy.  Colonoscopy.  How is this treated? Treatment for this condition depends on the cause. If you continue to lose a lot of blood, you may need to be treated at a hospital. Treatment may include:  Taking supplements of iron, vitamin R51, or folic acid.  Taking a hormone medicine (erythropoietin) that can help to stimulate red blood cell growth.  Having a blood transfusion. This may be needed if you lose a lot of blood.  Making changes to your diet.  Having surgery to remove your spleen.  Follow these instructions at home:  Take over-the-counter and prescription medicines only as told by your health care provider.  Take supplements only as told by your health care provider.  Follow any diet instructions that you were given.  Keep all follow-up visits as told by your health care provider. This is important. Contact a health care provider if:  You develop new bleeding anywhere in the body. Get help right away if:  You are very weak.  You are short of breath.  You have pain in your abdomen or chest.  You are dizzy or feel faint.  You have trouble concentrating.  You have bloody or black, tarry stools.  You vomit repeatedly or you vomit up blood. Summary  Anemia  is a condition in which you do not have enough red blood cells or enough of a substance in your red blood cells that carries oxygen (hemoglobin).  Symptoms may occur suddenly or develop slowly.  If your anemia is mild, you may not have symptoms.  This condition is diagnosed with blood tests as well as a medical history and physical exam. Other tests may be  needed.  Treatment for this condition depends on the cause of the anemia. This information is not intended to replace advice given to you by your health care provider. Make sure you discuss any questions you have with your health care provider. Document Released: 08/12/2004 Document Revised: 08/06/2016 Document Reviewed: 08/06/2016 Elsevier Interactive Patient Education  [employer].

## 2017-08-05 LAB — CBC WITH DIFFERENTIAL/PLATELET
Basophils Absolute: 0 10*3/uL (ref 0.0–0.2)
Basos: 0 %
EOS (ABSOLUTE): 0 10*3/uL (ref 0.0–0.4)
EOS: 1 %
HEMATOCRIT: 45 % (ref 37.5–51.0)
Hemoglobin: 15.1 g/dL (ref 13.0–17.7)
IMMATURE GRANULOCYTES: 0 %
Immature Grans (Abs): 0 10*3/uL (ref 0.0–0.1)
Lymphocytes Absolute: 1.3 10*3/uL (ref 0.7–3.1)
Lymphs: 22 %
MCH: 29.9 pg (ref 26.6–33.0)
MCHC: 33.6 g/dL (ref 31.5–35.7)
MCV: 89 fL (ref 79–97)
MONOS ABS: 0.4 10*3/uL (ref 0.1–0.9)
Monocytes: 6 %
NEUTROS PCT: 71 %
Neutrophils Absolute: 4 10*3/uL (ref 1.4–7.0)
PLATELETS: 200 10*3/uL (ref 150–379)
RBC: 5.05 x10E6/uL (ref 4.14–5.80)
RDW: 13.7 % (ref 12.3–15.4)
WBC: 5.7 10*3/uL (ref 3.4–10.8)

## 2017-08-05 LAB — CMP14+EGFR
ALT: 25 IU/L (ref 0–44)
AST: 19 IU/L (ref 0–40)
Albumin/Globulin Ratio: 1.6 (ref 1.2–2.2)
Albumin: 4.5 g/dL (ref 3.5–5.5)
Alkaline Phosphatase: 81 IU/L (ref 39–117)
BUN/Creatinine Ratio: 10 (ref 9–20)
BUN: 10 mg/dL (ref 6–24)
Bilirubin Total: 0.6 mg/dL (ref 0.0–1.2)
CALCIUM: 9.3 mg/dL (ref 8.7–10.2)
CO2: 23 mmol/L (ref 20–29)
CREATININE: 0.98 mg/dL (ref 0.76–1.27)
Chloride: 103 mmol/L (ref 96–106)
GFR calc Af Amer: 100 mL/min/{1.73_m2} (ref >59)
GFR, EST NON AFRICAN AMERICAN: 86 mL/min/{1.73_m2} (ref >59)
GLOBULIN, TOTAL: 2.8 g/dL (ref 1.5–4.5)
Glucose: 94 mg/dL (ref 65–99)
Potassium: 4.2 mmol/L (ref 3.5–5.2)
Sodium: 140 mmol/L (ref 134–144)
Total Protein: 7.3 g/dL (ref 6.0–8.5)

## 2017-11-29 ENCOUNTER — Ambulatory Visit: Payer: Federal, State, Local not specified - PPO | Admitting: Family Medicine

## 2017-11-29 ENCOUNTER — Encounter: Payer: Self-pay | Admitting: Family Medicine

## 2017-11-29 VITALS — BP 92/61 | HR 73 | Temp 97.0°F | Ht 72.0 in | Wt 213.4 lb

## 2017-11-29 DIAGNOSIS — W57XXXA Bitten or stung by nonvenomous insect and other nonvenomous arthropods, initial encounter: Secondary | ICD-10-CM | POA: Diagnosis not present

## 2017-11-29 DIAGNOSIS — Z91018 Allergy to other foods: Secondary | ICD-10-CM | POA: Diagnosis not present

## 2017-11-29 DIAGNOSIS — R21 Rash and other nonspecific skin eruption: Secondary | ICD-10-CM | POA: Diagnosis not present

## 2017-11-29 MED ORDER — DOXYCYCLINE HYCLATE 100 MG PO TABS: 100.0000 mg | ORAL_TABLET | Freq: Two times a day (BID) | ORAL | 0 refills | Status: DC

## 2017-11-29 NOTE — Progress Notes (Signed)
Chief Complaint  Patient presents with  . Tick Removal    pt here today c/o tick bite on left buttock    HPI  Patient presents today for awakens during the night, early morning of 11/26/2017 when he felt itching at the left buttock and fell to take.  He removed the tick and saved it.  He did not notice problem until today his wife told him it was looking red around it and at lunchtime today he tried eating a hamburger and almost choked on the first bite.  He brought the tick with him today in a plastic bag.  PMH: Smoking status noted ROS: Per HPI  Objective: BP 92/61   Pulse 73   Temp (!) 97 F (36.1 C) (Oral)   Ht 6' (1.829 m)   Wt 213 lb 6 oz (96.8 kg)   BMI 28.94 kg/m  Gen: NAD, alert, cooperative with exam HEENT: NCAT, EOMI, PERRL Resp: CTABL, no wheezes, non-labored Abd: SNTND, there is a 2 cm area of erythema around a 1 mm nidus.  This is located at the upper outer quadrant of the left gluteal region.  It is minimally tender.  There is no fluctuance.  There is blanching.  The area is not edematous or raised.  The tick was inspected and has a vague white dot on the dorsum of the abdomen.  It is the size of a standard dog tick that has not engorged. Ext: No edema, warm Neuro: Alert and oriented, No gross deficits Skin: Other than the local lesion noted above there is no rash/eruption noted.   Assessment and plan:  1. Tick bite, initial encounter   2. 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 doxycycline (VIBRA-TABS) 100 MG tablet    Sig: Take 1 tablet (100 mg total) by mouth 2 (two) times daily.    Dispense:  14 tablet    Refill:  0    Orders Placed This Encounter  Procedures  . CBC with Differential/Platelet  . CMP14+EGFR  . Alpha-Gal Panel  . Rocky mtn spotted fvr abs pnl(IgG+IgM)    Follow up as needed.  Claretta Fraise, MD

## 2017-12-02 LAB — CMP14+EGFR
ALBUMIN: 4.5 g/dL (ref 3.5–5.5)
ALK PHOS: 79 IU/L (ref 39–117)
ALT: 30 IU/L (ref 0–44)
AST: 25 IU/L (ref 0–40)
Albumin/Globulin Ratio: 1.7 (ref 1.2–2.2)
BUN / CREAT RATIO: 15 (ref 9–20)
BUN: 15 mg/dL (ref 6–24)
Bilirubin Total: 0.5 mg/dL (ref 0.0–1.2)
CO2: 23 mmol/L (ref 20–29)
CREATININE: 0.98 mg/dL (ref 0.76–1.27)
Calcium: 9.3 mg/dL (ref 8.7–10.2)
Chloride: 100 mmol/L (ref 96–106)
GFR calc Af Amer: 100 mL/min/{1.73_m2} (ref >59)
GFR calc non Af Amer: 86 mL/min/{1.73_m2} (ref >59)
GLOBULIN, TOTAL: 2.6 g/dL (ref 1.5–4.5)
GLUCOSE: 89 mg/dL (ref 65–99)
Potassium: 4 mmol/L (ref 3.5–5.2)
SODIUM: 135 mmol/L (ref 134–144)
TOTAL PROTEIN: 7.1 g/dL (ref 6.0–8.5)

## 2017-12-02 LAB — ROCKY MTN SPOTTED FVR ABS PNL(IGG+IGM)
RMSF IGM: 0.18 {index} (ref 0.00–0.89)
RMSF IgG: NEGATIVE

## 2017-12-02 LAB — CBC WITH DIFFERENTIAL/PLATELET
BASOS ABS: 0 10*3/uL (ref 0.0–0.2)
Basos: 1 %
EOS (ABSOLUTE): 0.1 10*3/uL (ref 0.0–0.4)
EOS: 2 %
HEMATOCRIT: 44.6 % (ref 37.5–51.0)
HEMOGLOBIN: 15.1 g/dL (ref 13.0–17.7)
IMMATURE GRANS (ABS): 0 10*3/uL (ref 0.0–0.1)
Immature Granulocytes: 0 %
LYMPHS ABS: 1.9 10*3/uL (ref 0.7–3.1)
LYMPHS: 31 %
MCH: 30 pg (ref 26.6–33.0)
MCHC: 33.9 g/dL (ref 31.5–35.7)
MCV: 89 fL (ref 79–97)
MONOCYTES: 9 %
Monocytes Absolute: 0.5 10*3/uL (ref 0.1–0.9)
Neutrophils Absolute: 3.5 10*3/uL (ref 1.4–7.0)
Neutrophils: 57 %
Platelets: 205 10*3/uL (ref 150–379)
RBC: 5.04 x10E6/uL (ref 4.14–5.80)
RDW: 13.7 % (ref 12.3–15.4)
WBC: 6.1 10*3/uL (ref 3.4–10.8)

## 2017-12-02 LAB — ALPHA-GAL PANEL
ALPHA GAL IGE: 0.17 kU/L — AB (ref <0.10)
Class Interpretation: 0
Class Interpretation: 0
Lamb/Mutton (Ovis spp) IgE: <0.1 kU/L (ref <0.35)
PORK CLASS INTERPRETATION: 0
Pork (Sus spp) IgE: <0.1 kU/L (ref <0.35)

## 2017-12-05 ENCOUNTER — Encounter: Payer: Self-pay | Admitting: Family Medicine

## 2018-03-02 DIAGNOSIS — H40033 Anatomical narrow angle, bilateral: Secondary | ICD-10-CM | POA: Diagnosis not present

## 2018-03-02 DIAGNOSIS — H04123 Dry eye syndrome of bilateral lacrimal glands: Secondary | ICD-10-CM | POA: Diagnosis not present

## 2018-04-11 ENCOUNTER — Ambulatory Visit: Payer: Federal, State, Local not specified - PPO | Admitting: Family Medicine

## 2018-04-11 ENCOUNTER — Encounter: Payer: Self-pay | Admitting: Family Medicine

## 2018-04-11 VITALS — BP 138/90 | HR 72 | Temp 98.4°F | Ht 72.0 in | Wt 208.0 lb

## 2018-04-11 DIAGNOSIS — B9689 Other specified bacterial agents as the cause of diseases classified elsewhere: Secondary | ICD-10-CM | POA: Diagnosis not present

## 2018-04-11 DIAGNOSIS — J019 Acute sinusitis, unspecified: Secondary | ICD-10-CM

## 2018-04-11 MED ORDER — AZITHROMYCIN 250 MG PO TABS: ORAL_TABLET | ORAL | 0 refills | Status: DC

## 2018-04-11 NOTE — Patient Instructions (Addendum)
Your prescribed a Z-Pak.  This should cover for upper respiratory and lower respiratory infection.  If symptoms worsen, you develop wheeze, blood in your cough, high fevers etc., please seek immediate medical attention  Sinusitis, Adult Sinusitis is soreness and inflammation of your sinuses. Sinuses are hollow spaces in the bones around your face. They are located:  Around your eyes.  In the middle of your forehead.  Behind your nose.  In your cheekbones.  Your sinuses and nasal passages are lined with a stringy fluid (mucus). Mucus normally drains out of your sinuses. When your nasal tissues get inflamed or swollen, the mucus can get trapped or blocked so air cannot flow through your sinuses. This lets bacteria, viruses, and funguses grow, and that leads to infection. Follow these instructions at home: Medicines  Take, use, or apply over-the-counter and prescription medicines only as told by your doctor. These may include nasal sprays.  If you were prescribed an antibiotic medicine, take it as told by your doctor. Do not stop taking the antibiotic even if you start to feel better. Hydrate and Humidify  Drink enough water to keep your pee (urine) clear or pale yellow.  Use a cool mist humidifier to keep the humidity level in your home above 50%.  Breathe in steam for 10-15 minutes, 3-4 times a day or as told by your doctor. You can do this in the bathroom while a hot shower is running.  Try not to spend time in cool or dry air. Rest  Rest as much as possible.  Sleep with your head raised (elevated).  Make sure to get enough sleep each night. General instructions  Put a warm, moist washcloth on your face 3-4 times a day or as told by your doctor. This will help with discomfort.  Wash your hands often with soap and water. If there is no soap and water, use hand sanitizer.  Do not smoke. Avoid being around people who are smoking (secondhand smoke).  Keep all follow-up visits  as told by your doctor. This is important. Contact a doctor if:  You have a fever.  Your symptoms get worse.  Your symptoms do not get better within 10 days. Get help right away if:  You have a very bad headache.  You cannot stop throwing up (vomiting).  You have pain or swelling around your face or eyes.  You have trouble seeing.  You feel confused.  Your neck is stiff.  You have trouble breathing. This information is not intended to replace advice given to you by your health care provider. Make sure you discuss any questions you have with your health care provider. Document Released: 12/22/2007 Document Revised: 02/29/2016 Document Reviewed: 04/30/2015 Elsevier Interactive Patient Education  [employer]2018 Elsevier Inc.

## 2018-04-11 NOTE — Progress Notes (Signed)
Subjective: CC: URI PCP: Mechele Claude, MD [id number] B Taras is a 57 y.o. male presenting to clinic today for:  1. URI Patient reports onset of symptoms on Sunday.  He notes facial pressure radiating down into the teeth.  He reports a dry cough.  He reports difficulty with deep breathing.  No hemoptysis, wheeze or measured fevers.  He has been using over-the-counter NyQuil and DayQuil with little improvement in symptoms.  He is a non-smoker.  No history of pulmonary disease.   ROS: Per HPI  Allergies  Allergen Reactions  . Penicillins     Has patient had a PCN reaction causing immediate rash, facial/tongue/throat swelling, SOB or lightheadedness with hypotension: No Has patient had a PCN reaction causing severe rash involving mucus membranes or skin necrosis: No Has patient had a PCN reaction that required hospitalization No Has patient had a PCN reaction occurring within the last 10 years: No If all of the above answers are "NO", then may proceed with Cephalosporin use. Childhood allergy   Past Medical History:  Diagnosis Date  . Arthritis    ALL JOINTS HURT  . Chest pain    HAS CHEST PAINS THAT PT ATTRIBUTES TO HIS ANEMIA --STATES PAST HX OF ABNORMAL EKGS BUT NEGATIVE STRESS TESTS.  Marland Kitchen Hemorrhoid    BLEEDING FROM HEMORRHOIDS FOR YRS  . Iron deficiency anemia   . Leukopenia 09/27/2013  . Shortness of breath    ATTRIBUTES TO THE POLLEN AND TO HIS ANEMIA  . Sleep apnea    PT STATES TOLD MILD - DID NOT NEED CPAP    Current Outpatient Medications:  .  Ferrous Sulfate (IRON) 325 (65 Fe) MG TABS, Take 1 tablet by mouth daily., Disp: , Rfl:  .  fluticasone (FLONASE) 50 MCG/ACT nasal spray, Place 1 spray into the nose daily., Disp: 16 g, Rfl: 6 .  ibuprofen (ADVIL,MOTRIN) 200 MG tablet, Take 400 mg by mouth every 6 (six) hours as needed for moderate pain., Disp: , Rfl:  .  loratadine (CLARITIN) 10 MG tablet, Take 10 mg by mouth daily., Disp: , Rfl:  Social History    Socioeconomic History  . Marital status: Married    Spouse name: Not on file  . Number of children: Not on file  . Years of education: Not on file  . Highest education level: Not on file  Occupational History  . Not on file  Social Needs  . Financial resource strain: Not on file  . Food insecurity:    Worry: Not on file    Inability: Not on file  . Transportation needs:    Medical: Not on file    Non-medical: Not on file  Tobacco Use  . Smoking status: Never Smoker  . Smokeless tobacco: Never Used  Substance and Sexual Activity  . Alcohol use: Yes  . Drug use: No  . Sexual activity: Not on file  Lifestyle  . Physical activity:    Days per week: Not on file    Minutes per session: Not on file  . Stress: Not on file  Relationships  . Social connections:    Talks on phone: Not on file    Gets together: Not on file    Attends religious service: Not on file    Active member of club or organization: Not on file    Attends meetings of clubs or organizations: Not on file    Relationship status: Not on file  . Intimate partner violence:    Fear of  current or ex partner: Not on file    Emotionally abused: Not on file    Physically abused: Not on file    Forced sexual activity: Not on file  Other Topics Concern  . Not on file  Social History Narrative  . Not on file   Family History  Problem Relation Age of Onset  . Cancer Mother        brain tumors  . Alzheimer's disease Father     Objective: Office vital signs reviewed. BP 138/90   Pulse 72   Temp 98.4 F (36.9 C) (Oral)   Ht 6' (1.829 m)   Wt 208 lb (94.3 kg)   SpO2 99%   BMI 28.21 kg/m   Physical Examination:  General: Awake, alert, well nourished, appears tired. No acute distress HEENT: +TTP to maxillary sinuses    Neck: No masses palpated. No lymphadenopathy    Ears: Tympanic membranes intact, normal light reflex, no erythema, no bulging    Eyes: PERRLA, extraocular membranes intact, sclera white     Nose: nasal turbinates moist, clear nasal discharge    Throat: moist mucus membranes, no erythema, no tonsillar exudate.  Airway is patent Cardio: regular rate and rhythm, S1S2 heard, no murmurs appreciated Pulm: clear to auscultation bilaterally, no wheezes, rhonchi or rales; normal work of breathing on room air  Assessment/ Plan: 57 y.o. male   1. Acute bacterial sinusitis Patient is afebrile nontoxic-appearing.  His physical exam was remarkable for sinus tenderness to palpation to the maxillary sinuses.  He subjectively does feel like he has difficulty taking a deep breath and he has normal work of breathing, normal pulmonary exam and normal oxygen saturation on room air.  I do not think he needs a chest x-ray at this time.  I have placed him on a Z-Pak to empirically cover for possible lower respiratory involvement but at this time, clinically appears consistent with a bacterial sinusitis.  Reasons for return discussed.  Reason for emergent evaluation in the emergency department discussed.  Handout provided.  Follow-up PRN.   Meds ordered this encounter  Medications  . azithromycin (ZITHROMAX Z-PAK) 250 MG tablet    Sig: As directed    Dispense:  6 tablet    Refill:  0     Ashly Hulen SkainsM Gottschalk, DO [facility] [phone]

## 2018-04-13 ENCOUNTER — Telehealth: Payer: Self-pay | Admitting: Family Medicine

## 2018-04-13 NOTE — Telephone Encounter (Signed)
This sounds appropriate.  You can let the patient know that a referral will be placed.  And please let Dr. Nadine Counts know.  In June if you can place the referral to whoever Dr. Nadine Counts prefers for ear nose and throat in that way it will also be under her name?

## 2018-04-13 NOTE — Telephone Encounter (Signed)
Sinus pressure not going away, behind eyes He has had to have sinus surgery in the past.   Not better today, still swollen.  Urgent referral please   Yetta Barre to address for Dr Nadine Counts

## 2018-04-15 ENCOUNTER — Ambulatory Visit: Payer: Federal, State, Local not specified - PPO | Admitting: Family Medicine

## 2018-04-15 VITALS — BP 131/88 | HR 80 | Temp 97.0°F | Ht 72.0 in | Wt 208.0 lb

## 2018-04-15 DIAGNOSIS — J320 Chronic maxillary sinusitis: Secondary | ICD-10-CM

## 2018-04-15 MED ORDER — CEFTRIAXONE SODIUM 1 G IJ SOLR: 1.0000 g | Freq: Once | INTRAMUSCULAR | Status: DC

## 2018-04-15 MED ORDER — METHYLPREDNISOLONE ACETATE 80 MG/ML IJ SUSP
80.0000 mg | Freq: Once | INTRAMUSCULAR | Status: AC
  Administered 2018-04-15: 80 mg via INTRAMUSCULAR

## 2018-04-15 MED ORDER — PREDNISONE 20 MG PO TABS: ORAL_TABLET | ORAL | 0 refills | Status: DC

## 2018-04-15 NOTE — Addendum Note (Signed)
Addended by: Tamera Punt on: 04/15/2018 11:24 AM   Modules accepted: Orders

## 2018-04-15 NOTE — Progress Notes (Signed)
BP 131/88   Pulse 80   Temp (!) 97 F (36.1 C) (Oral)   Ht 6' (1.829 m)   Wt 208 lb (94.3 kg)   BMI 28.21 kg/m    Subjective:    Patient ID: Andrew Villegas, male    DOB: 1961-07-07, 57 y.o.   MRN: [id number]  HPI: Andrew Villegas is a 57 y.o. male presenting on 04/15/2018 for Sinusitis (Seen Tuesday and finished azithromycin this morning ) and sinus pressure   HPI Sinus pressure and congestion Patient comes in complaining of sinus congestion and pressure that has been causing him issues since about a week ago.  He was seen 4 days ago here in our office and given azithromycin which helped some but not significantly.  He still has a lot of pressure in that right side of his face and he says now he started to get a haziness in his vision.  He did have this once previously and had to have surgery with ENT in 1999 from it on the other side.  This is the first time he is gotten this bad on this side.  He denies any fevers or chills or shortness of breath or wheezing.  He does feel a lot of pressure in his ear on that side as well as the frontal and maxillary sinus on the right side.  He is also been using Flonase nasal saline.  Relevant past medical, surgical, family and social history reviewed and updated as indicated. Interim medical history since our last visit reviewed. Allergies and medications reviewed and updated.  Review of Systems  Constitutional: Negative for chills and fever.  HENT: Positive for congestion, ear pain, postnasal drip, rhinorrhea, sinus pressure, sneezing and sore throat. Negative for ear discharge and voice change.   Eyes: Negative for pain, discharge, redness and visual disturbance.  Respiratory: Positive for cough. Negative for shortness of breath and wheezing.   Cardiovascular: Negative for chest pain and leg swelling.  Musculoskeletal: Negative for gait problem.  Skin: Negative for rash.  All other systems reviewed and are negative.   Per HPI unless  specifically indicated above   Allergies as of 04/15/2018      Reactions   Penicillins Anaphylaxis   Has patient had a PCN reaction causing immediate rash, facial/tongue/throat swelling, SOB or lightheadedness with hypotension: No Has patient had a PCN reaction causing severe rash involving mucus membranes or skin necrosis: No Has patient had a PCN reaction that required hospitalization No Has patient had a PCN reaction occurring within the last 10 years: No If all of the above answers are "NO", then may proceed with Cephalosporin use. Childhood allergy      Medication List        Accurate as of 04/15/18  9:14 AM. Always use your most recent med list.          azithromycin 250 MG tablet Commonly known as:  ZITHROMAX As directed   fluticasone 50 MCG/ACT nasal spray Commonly known as:  FLONASE Place 1 spray into the nose daily.   ibuprofen 200 MG tablet Commonly known as:  ADVIL,MOTRIN Take 400 mg by mouth every 6 (six) hours as needed for moderate pain.   Iron 325 (65 Fe) MG Tabs Take 1 tablet by mouth daily.   loratadine 10 MG tablet Commonly known as:  CLARITIN Take 10 mg by mouth daily.   predniSONE 20 MG tablet Commonly known as:  DELTASONE Take 3 tabs daily for 1 week, then 2 tabs  daily for week 2, then 1 tab daily for week 3.          Objective:    BP 131/88   Pulse 80   Temp (!) 97 F (36.1 C) (Oral)   Ht 6' (1.829 m)   Wt 208 lb (94.3 kg)   BMI 28.21 kg/m   Wt Readings from Last 3 Encounters:  04/15/18 208 lb (94.3 kg)  04/11/18 208 lb (94.3 kg)  11/29/17 213 lb 6 oz (96.8 kg)    Physical Exam  Constitutional: He is oriented to person, place, and time. He appears well-developed and well-nourished. No distress.  HENT:  Right Ear: Tympanic membrane, external ear and ear canal normal.  Left Ear: Tympanic membrane, external ear and ear canal normal.  Nose: Mucosal edema and rhinorrhea present. No sinus tenderness. No epistaxis. Right sinus  exhibits maxillary sinus tenderness. Right sinus exhibits no frontal sinus tenderness. Left sinus exhibits no maxillary sinus tenderness and no frontal sinus tenderness.  Mouth/Throat: Uvula is midline and mucous membranes are normal. No oropharyngeal exudate, posterior oropharyngeal edema, posterior oropharyngeal erythema or tonsillar abscesses.  Eyes: Pupils are equal, round, and reactive to light. Conjunctivae and EOM are normal. Right eye exhibits no discharge. No scleral icterus.  Neck: Neck supple. No thyromegaly present.  Cardiovascular: Normal rate, regular rhythm, normal heart sounds and intact distal pulses.  No murmur heard. Pulmonary/Chest: Effort normal and breath sounds normal. No respiratory distress. He has no wheezes. He has no rales.  Musculoskeletal: Normal range of motion. He exhibits no edema.  Lymphadenopathy:    He has no cervical adenopathy.  Neurological: He is alert and oriented to person, place, and time. Coordination normal.  Skin: Skin is warm and dry. No rash noted. He is not diaphoretic.  Psychiatric: He has a normal mood and affect. His behavior is normal.  Nursing note and vitals reviewed.       Assessment & Plan:   Problem List Items Addressed This Visit    None    Visit Diagnoses    Right maxillary sinusitis    -  Primary   Relevant Medications   methylPREDNISolone acetate (DEPO-MEDROL) injection 80 mg (Start on 04/15/2018  9:15 AM)   predniSONE (DELTASONE) 20 MG tablet      Will give patient prednisone and Depo shot  Follow up plan: Return if symptoms worsen or fail to improve.  Counseling provided for all of the vaccine components No orders of the defined types were placed in this encounter.   Arville Care, MD [facility] 04/15/2018, 9:14 AM

## 2018-04-28 NOTE — Telephone Encounter (Signed)
Looks like referral was placed? Patient saw Dr Dettinger on 9/28.  Please cc Debbie if patient has not heard for an appointment.

## 2018-05-04 NOTE — Telephone Encounter (Signed)
Pt has appt with ENT on 05/11/18

## 2018-05-11 DIAGNOSIS — J328 Other chronic sinusitis: Secondary | ICD-10-CM | POA: Diagnosis not present

## 2018-05-22 DIAGNOSIS — J320 Chronic maxillary sinusitis: Secondary | ICD-10-CM | POA: Diagnosis not present

## 2018-05-22 DIAGNOSIS — J0130 Acute sphenoidal sinusitis, unspecified: Secondary | ICD-10-CM | POA: Diagnosis not present

## 2018-05-22 DIAGNOSIS — J329 Chronic sinusitis, unspecified: Secondary | ICD-10-CM | POA: Diagnosis not present

## 2018-12-26 DIAGNOSIS — R6889 Other general symptoms and signs: Secondary | ICD-10-CM | POA: Diagnosis not present

## 2019-02-09 NOTE — Congregational Nurse Program (Signed)
  Dept: [phone]   Congregational Nurse Program Note  Date of Encounter: 02/09/2019  Past Medical History: Past Medical History:  Diagnosis Date  . Arthritis    ALL JOINTS HURT  . Chest pain    HAS CHEST PAINS THAT PT ATTRIBUTES TO HIS ANEMIA --STATES PAST HX OF ABNORMAL EKGS BUT NEGATIVE STRESS TESTS.  Marland Kitchen Hemorrhoid    BLEEDING FROM HEMORRHOIDS FOR YRS  . Iron deficiency anemia   . Leukopenia 09/27/2013  . Shortness of breath    ATTRIBUTES TO THE POLLEN AND TO HIS ANEMIA  . Sleep apnea    PT STATES TOLD MILD - DID NOT NEED CPAP    Encounter Details: CNP Questionnaire - 02/09/19 1422      Questionnaire   Race  White or Caucasian    Location Patient Served At  [place]    Uninsured  Not Applicable    Food  No food insecurities    Housing/Utilities  Yes, have permanent housing    Transportation  No transportation needs    Interpersonal Safety  Yes, feel physically and emotionally safe where you currently live    Medication  No medication insecurities    Medical Provider  Yes    Referrals  Not Applicable    ED Visit Averted  Not Applicable    Life-Saving Intervention Made  Not Applicable     8/92/1194 1422hrs.  Returned for B/P follow up BP low 96/58/ P 75. Says he knows when it gets low.advised to see his /doctor.  States he will if necessary. Also says this happens off and on at times .  Going home to take his Iron and Ped. lyte liquids that was recommended for him.  Family will be waiting for him @ home if he still feel weak will  rest first and get out of heat.  Then see MD if needed. Theron Arista, RN [phone].

## 2019-02-09 NOTE — Congregational Nurse Program (Signed)
  Dept: [phone]   Congregational Nurse Program Note  Date of Encounter: 02/09/2019  Past Medical History: Past Medical History:  Diagnosis Date  . Arthritis    ALL JOINTS HURT  . Chest pain    HAS CHEST PAINS THAT PT ATTRIBUTES TO HIS ANEMIA --STATES PAST HX OF ABNORMAL EKGS BUT NEGATIVE STRESS TESTS.  Marland Kitchen Hemorrhoid    BLEEDING FROM HEMORRHOIDS FOR YRS  . Iron deficiency anemia   . Leukopenia 09/27/2013  . Shortness of breath    ATTRIBUTES TO THE POLLEN AND TO HIS ANEMIA  . Sleep apnea    PT STATES TOLD MILD - DID NOT NEED CPAP    Encounter Details: CNP Questionnaire - 02/09/19 1316      Questionnaire   Patient Status  Not Applicable    Race  White or Caucasian    Location Patient Served At  [place]    Uninsured  Not Applicable    Food  No food insecurities    Housing/Utilities  Yes, have permanent housing    Transportation  No transportation needs    Interpersonal Safety  Yes, feel physically and emotionally safe where you currently live    Medication  No medication insecurities    Medical Provider  Yes    Referrals  Not Applicable    ED Visit Averted  Not Applicable    Life-Saving Intervention Made  Not Applicable     5/68/1275  History of Anemia, Feel weak B/P (R) arm 98/64 Pulse 71;  (L) arm 99/66 P 72 .  States he gets like this often, takes iron and ASA ,rehydrates self and gets better. 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 Rested x35 minutes felt better.  Recheck B/P 115/76 P 67. Advised to check with his doctor if he feels this way again.States his Dr. Is aware of this.  Theron Arista, RN [phone].

## 2019-02-16 NOTE — Congregational Nurse Program (Signed)
  Dept: [phone]   Congregational Nurse Program Note  Date of Encounter: 02/16/2019  Past Medical History: Past Medical History:  Diagnosis Date  . Arthritis    ALL JOINTS HURT  . Chest pain    HAS CHEST PAINS THAT PT ATTRIBUTES TO HIS ANEMIA --STATES PAST HX OF ABNORMAL EKGS BUT NEGATIVE STRESS TESTS.  Marland Kitchen Hemorrhoid    BLEEDING FROM HEMORRHOIDS FOR YRS  . Iron deficiency anemia   . Leukopenia 09/27/2013  . Shortness of breath    ATTRIBUTES TO THE POLLEN AND TO HIS ANEMIA  . Sleep apnea    PT STATES TOLD MILD - DID NOT NEED CPAP    Encounter Details: CNP Questionnaire - 02/16/19 1257      Questionnaire   Patient Status  Not Applicable    Race  White or Caucasian    Location Patient Served At  [place]    Uninsured  Not Applicable    Food  No food insecurities    Housing/Utilities  Yes, have permanent housing    Transportation  No transportation needs    Interpersonal Safety  Yes, feel physically and emotionally safe where you currently live    Medication  No medication insecurities    Medical Provider  Yes    Referrals  Not Applicable    ED Visit Averted  Not Applicable   States when he gets weak , he knows to rest and hydrates self     02/16/2019 12:57   Want B/P checked , Still a little weak. Taking his iron and trying to stay hydrated . B/P 101/67 Pulse 90.  Feeling some better.  Marko Plume [phone]

## 2019-03-02 NOTE — Congregational Nurse Program (Signed)
  Dept: [phone]   Congregational Nurse Program Note  Date of Encounter: 02/23/2019  Past Medical History: Past Medical History:  Diagnosis Date  . Arthritis    ALL JOINTS HURT  . Chest pain    HAS CHEST PAINS THAT PT ATTRIBUTES TO HIS ANEMIA --STATES PAST HX OF ABNORMAL EKGS BUT NEGATIVE STRESS TESTS.  Marland Kitchen Hemorrhoid    BLEEDING FROM HEMORRHOIDS FOR YRS  . Iron deficiency anemia   . Leukopenia 09/27/2013  . Shortness of breath    ATTRIBUTES TO THE POLLEN AND TO HIS ANEMIA  . Sleep apnea    PT STATES TOLD MILD - DID NOT NEED CPAP    Encounter Details: CNP Questionnaire - 03/02/19 1100      Questionnaire   Patient Status  Not Applicable    Race  White or Caucasian    Location Patient Served At  [place]    Uninsured  Not Applicable    Food  No food insecurities    Housing/Utilities  Yes, have permanent housing    Transportation  No transportation needs    Interpersonal Safety  Yes, feel physically and emotionally safe where you currently live    Medication  No medication insecurities    Medical Provider  Yes    Referrals  Not Applicable    ED Visit Averted  Not Applicable    Life-Saving Intervention Made  Not Applicable      4/97/0263 Still feel weak @ times , Diaphoretic,busy working this AM .B/P 93/65 Pulse 81, took his Iron tabs, staying hydrated.  Just wanted B/P checked. Keeping check on. Denies any pain.  Theron Arista, RN [phone].

## 2019-03-09 NOTE — Congregational Nurse Program (Signed)
  Dept: [phone]   Congregational Nurse Program Note  Date of Encounter: 03/09/2019  Past Medical History: Past Medical History:  Diagnosis Date  . Arthritis    ALL JOINTS HURT  . Chest pain    HAS CHEST PAINS THAT PT ATTRIBUTES TO HIS ANEMIA --STATES PAST HX OF ABNORMAL EKGS BUT NEGATIVE STRESS TESTS.  Marland Kitchen Hemorrhoid    BLEEDING FROM HEMORRHOIDS FOR YRS  . Iron deficiency anemia   . Leukopenia 09/27/2013  . Shortness of breath    ATTRIBUTES TO THE POLLEN AND TO HIS ANEMIA  . Sleep apnea    PT STATES TOLD MILD - DID NOT NEED CPAP    Encounter Details: CNP Questionnaire - 03/09/19 1225      Questionnaire   Patient Status  Not Applicable    Race  White or Caucasian    Location Patient Served At  [place]  Not Applicable    Food  No food insecurities    Housing/Utilities  Yes, have permanent housing    Transportation  No transportation needs    Interpersonal Safety  Yes, feel physically and emotionally safe where you currently live    Medication  No medication insecurities    Medical Provider  Yes    Referrals  Not Applicable    ED Visit Averted  Not Applicable    Life-Saving Intervention Made  Not Applicable     8 /05/5725 1200 Hrs.  Keeping check on B/P still weak @ times.  Taking Iron tabs as ordered.  B/P 105/68 Pulse 80.  Feeling better. Taking Vacation next week to get some rest.  Theron Arista, RN [phone].

## 2019-03-30 NOTE — Congregational Nurse Program (Signed)
  Dept: [phone]   Congregational Nurse Program Note  Date of Encounter: 03/30/2019  Past Medical History: Past Medical History:  Diagnosis Date  . Arthritis    ALL JOINTS HURT  . Chest pain    HAS CHEST PAINS THAT PT ATTRIBUTES TO HIS ANEMIA --STATES PAST HX OF ABNORMAL EKGS BUT NEGATIVE STRESS TESTS.  Marland Kitchen Hemorrhoid    BLEEDING FROM HEMORRHOIDS FOR YRS  . Iron deficiency anemia   . Leukopenia 09/27/2013  . Shortness of breath    ATTRIBUTES TO THE POLLEN AND TO HIS ANEMIA  . Sleep apnea    PT STATES TOLD MILD - DID NOT NEED CPAP    Encounter Details: CNP Questionnaire - 03/30/19 1030      Questionnaire   Patient Status  Not Applicable    Race  White or Caucasian    Location Patient Served At  [place]    Uninsured  Not Applicable    Food  No food insecurities    Housing/Utilities  Yes, have permanent housing    Transportation  No transportation needs    Interpersonal Safety  Yes, feel physically and emotionally safe where you currently live    Medication  No medication insecurities    Medical Provider  Yes    Referrals  Not Applicable    ED Visit Averted  Not Applicable    Life-Saving Intervention Made  Not Applicable     4/49/6759 1030hrs.  Keeping check on Blood Pressure , not high still running low. B/P 95/60 pulse 85. Following regime as told to do.  Taking Iron tabs and keeping hydrated. Leanna Lawson,RN,[phone].

## 2019-05-11 NOTE — Congregational Nurse Program (Signed)
  Dept: [phone]   Congregational Nurse Program Note  Date of Encounter: 05/11/2019  Past Medical History: Past Medical History:  Diagnosis Date  . Arthritis    ALL JOINTS HURT  . Chest pain    HAS CHEST PAINS THAT PT ATTRIBUTES TO HIS ANEMIA --STATES PAST HX OF ABNORMAL EKGS BUT NEGATIVE STRESS TESTS.  Marland Kitchen Hemorrhoid    BLEEDING FROM HEMORRHOIDS FOR YRS  . Iron deficiency anemia   . Leukopenia 09/27/2013  . Shortness of breath    ATTRIBUTES TO THE POLLEN AND TO HIS ANEMIA  . Sleep apnea    PT STATES TOLD MILD - DID NOT NEED CPAP    Encounter Details: CNP Questionnaire - 05/11/19 1030      Questionnaire   Patient Status  Not Applicable    Race  White or Caucasian    Location Patient Served At  [place]    Uninsured  Not Applicable    Food  No food insecurities    Housing/Utilities  Yes, have permanent housing    Transportation  No transportation needs    Interpersonal Safety  Yes, feel physically and emotionally safe where you currently live    Medication  No medication insecurities    Medical Provider  Yes    Referrals  Not Applicable    ED Visit Averted  Not Applicable    Life-Saving Intervention Made  Not Applicable     71/21/9758 10:30 AM check B/P Runs low B/P 107/70 Pulse 78.  Staying hydrated.  Tor Netters

## 2019-05-24 ENCOUNTER — Encounter: Payer: Self-pay | Admitting: Physician Assistant

## 2019-05-24 ENCOUNTER — Other Ambulatory Visit: Payer: Self-pay

## 2019-05-24 ENCOUNTER — Ambulatory Visit (INDEPENDENT_AMBULATORY_CARE_PROVIDER_SITE_OTHER): Payer: Federal, State, Local not specified - PPO | Admitting: Physician Assistant

## 2019-05-24 VITALS — BP 109/68 | HR 64 | Temp 97.2°F | Ht 72.0 in | Wt 214.0 lb

## 2019-05-24 DIAGNOSIS — R531 Weakness: Secondary | ICD-10-CM

## 2019-05-24 DIAGNOSIS — Z0000 Encounter for general adult medical examination without abnormal findings: Secondary | ICD-10-CM

## 2019-05-24 DIAGNOSIS — I950 Idiopathic hypotension: Secondary | ICD-10-CM | POA: Insufficient documentation

## 2019-05-24 DIAGNOSIS — R55 Syncope and collapse: Secondary | ICD-10-CM

## 2019-05-24 DIAGNOSIS — D72819 Decreased white blood cell count, unspecified: Secondary | ICD-10-CM

## 2019-05-24 DIAGNOSIS — R001 Bradycardia, unspecified: Secondary | ICD-10-CM | POA: Insufficient documentation

## 2019-05-24 LAB — HEMOGLOBIN, FINGERSTICK: Hemoglobin: 14.7 g/dL (ref 12.6–17.7)

## 2019-05-24 NOTE — Progress Notes (Signed)
Patient is scheduled for cardiology on 05/25/2019 

## 2019-05-24 NOTE — Progress Notes (Signed)
Patient is scheduled for cardiology on 05/25/2019
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
## 2019-05-25 ENCOUNTER — Encounter: Payer: Self-pay | Admitting: Cardiology

## 2019-05-25 ENCOUNTER — Ambulatory Visit: Payer: Federal, State, Local not specified - PPO | Admitting: Cardiology

## 2019-05-25 VITALS — BP 114/72 | HR 70 | Temp 97.3°F | Ht 72.0 in | Wt 216.4 lb

## 2019-05-25 DIAGNOSIS — Z7189 Other specified counseling: Secondary | ICD-10-CM

## 2019-05-25 DIAGNOSIS — R55 Syncope and collapse: Secondary | ICD-10-CM | POA: Diagnosis not present

## 2019-05-25 LAB — CBC WITH DIFFERENTIAL/PLATELET
Basophils Absolute: 0.1 10*3/uL (ref 0.0–0.2)
Basos: 1 %
EOS (ABSOLUTE): 0.1 10*3/uL (ref 0.0–0.4)
Eos: 2 %
Hematocrit: 43.1 % (ref 37.5–51.0)
Hemoglobin: 14.9 g/dL (ref 13.0–17.7)
Immature Grans (Abs): 0 10*3/uL (ref 0.0–0.1)
Immature Granulocytes: 0 %
Lymphocytes Absolute: 1.5 10*3/uL (ref 0.7–3.1)
Lymphs: 34 %
MCH: 30.5 pg (ref 26.6–33.0)
MCHC: 34.6 g/dL (ref 31.5–35.7)
MCV: 88 fL (ref 79–97)
Monocytes Absolute: 0.4 10*3/uL (ref 0.1–0.9)
Monocytes: 10 %
Neutrophils Absolute: 2.2 10*3/uL (ref 1.4–7.0)
Neutrophils: 53 %
Platelets: 185 10*3/uL (ref 150–450)
RBC: 4.88 x10E6/uL (ref 4.14–5.80)
RDW: 12.6 % (ref 11.6–15.4)
WBC: 4.3 10*3/uL (ref 3.4–10.8)

## 2019-05-25 LAB — CMP14+EGFR
ALT: 20 IU/L (ref 0–44)
AST: 22 IU/L (ref 0–40)
Albumin/Globulin Ratio: 1.4 (ref 1.2–2.2)
Albumin: 4 g/dL (ref 3.8–4.9)
Alkaline Phosphatase: 80 IU/L (ref 39–117)
BUN/Creatinine Ratio: 13 (ref 9–20)
BUN: 14 mg/dL (ref 6–24)
Bilirubin Total: 0.6 mg/dL (ref 0.0–1.2)
CO2: 22 mmol/L (ref 20–29)
Calcium: 9.3 mg/dL (ref 8.7–10.2)
Chloride: 101 mmol/L (ref 96–106)
Creatinine, Ser: 1.04 mg/dL (ref 0.76–1.27)
GFR calc Af Amer: 92 mL/min/{1.73_m2} (ref >59)
GFR calc non Af Amer: 79 mL/min/{1.73_m2} (ref >59)
Globulin, Total: 2.8 g/dL (ref 1.5–4.5)
Glucose: 87 mg/dL (ref 65–99)
Potassium: 4.2 mmol/L (ref 3.5–5.2)
Sodium: 139 mmol/L (ref 134–144)
Total Protein: 6.8 g/dL (ref 6.0–8.5)

## 2019-05-25 LAB — LIPID PANEL
Chol/HDL Ratio: 4.9 ratio (ref 0.0–5.0)
Cholesterol, Total: 210 mg/dL — ABNORMAL HIGH (ref 100–199)
HDL: 43 mg/dL (ref >39)
LDL Chol Calc (NIH): 132 mg/dL — ABNORMAL HIGH (ref 0–99)
Triglycerides: 198 mg/dL — ABNORMAL HIGH (ref 0–149)
VLDL Cholesterol Cal: 35 mg/dL (ref 5–40)

## 2019-05-25 LAB — PSA: Prostate Specific Ag, Serum: 1 ng/mL (ref 0.0–4.0)

## 2019-05-25 LAB — TSH: TSH: 2.8 u[IU]/mL (ref 0.450–4.500)

## 2019-05-25 NOTE — Patient Instructions (Signed)
Medication Instructions:  Your Physician recommend you continue on your current medication as directed.    *If you need a refill on your cardiac medications before your next appointment, please call your pharmacy*  Lab Work: None  Testing/Procedures: Your physician has requested that you have an echocardiogram. Echocardiography is a painless test that uses sound waves to create images of your heart. It provides your doctor with information about the size and shape of your heart and how well your heart's chambers and valves are working. This procedure takes approximately one hour. There are no restrictions for this procedure. [address]   Follow-Up: At [facility], you and your health needs are our priority.  As part of our continuing mission to provide you with exceptional heart care, we have created designated Provider Care Teams.  These Care Teams include your primary Cardiologist (physician) and Advanced Practice Providers (APPs -  Physician Assistants and Nurse Practitioners) who all work together to provide you with the care you need, when you need it.  Your next appointment:   As needed  The format for your next appointment:   Either In Person or Virtual  Provider:   Bridgette Christopher, MD   
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
## 2019-06-05 ENCOUNTER — Other Ambulatory Visit: Payer: Self-pay

## 2019-06-05 ENCOUNTER — Ambulatory Visit (HOSPITAL_COMMUNITY): Payer: Federal, State, Local not specified - PPO | Attending: Cardiology

## 2019-06-05 DIAGNOSIS — R55 Syncope and collapse: Secondary | ICD-10-CM | POA: Insufficient documentation

## 2019-06-06 NOTE — Progress Notes (Signed)
Agree with PCP follow up for next steps.

## 2019-07-24 DIAGNOSIS — Z03818 Encounter for observation for suspected exposure to other biological agents ruled out: Secondary | ICD-10-CM | POA: Diagnosis not present

## 2019-07-24 DIAGNOSIS — Z20828 Contact with and (suspected) exposure to other viral communicable diseases: Secondary | ICD-10-CM | POA: Diagnosis not present

## 2020-01-28 ENCOUNTER — Other Ambulatory Visit: Payer: Self-pay

## 2020-01-28 ENCOUNTER — Ambulatory Visit (HOSPITAL_COMMUNITY)
Admission: EM | Admit: 2020-01-28 | Discharge: 2020-01-28 | Disposition: A | Discharge status: Home / Self Care | Payer: Federal, State, Local not specified - PPO | Attending: Family Medicine | Admitting: Family Medicine

## 2020-01-28 ENCOUNTER — Encounter (HOSPITAL_COMMUNITY): Payer: Self-pay

## 2020-01-28 DIAGNOSIS — W57XXXA Bitten or stung by nonvenomous insect and other nonvenomous arthropods, initial encounter: Secondary | ICD-10-CM | POA: Diagnosis not present

## 2020-01-28 DIAGNOSIS — S70362A Insect bite (nonvenomous), left thigh, initial encounter: Secondary | ICD-10-CM | POA: Diagnosis not present

## 2020-01-28 NOTE — ED Triage Notes (Signed)
Pt presents with insect bite to inside of right thigh from yesterday.

## 2020-01-28 NOTE — ED Provider Notes (Signed)
[facility]    CSN: [id number] Arrival date & time: 01/28/20  1306      History   Chief Complaint Chief Complaint  Patient presents with  . Insect Bite    HPI Andrew Villegas is a 59 y.o. male.   Pt reports he had something bit him on his left inner thigh. Pt reports he has been putting antibiotic ointment on area but it continues to swell  The history is provided by the patient. No language interpreter was used.    Past Medical History:  Diagnosis Date  . Arthritis    ALL JOINTS HURT  . Chest pain    HAS CHEST PAINS THAT PT ATTRIBUTES TO HIS ANEMIA --STATES PAST HX OF ABNORMAL EKGS BUT NEGATIVE STRESS TESTS.  Marland Kitchen Hemorrhoid    BLEEDING FROM HEMORRHOIDS FOR YRS  . Iron deficiency anemia   . Leukopenia 09/27/2013  . Shortness of breath    ATTRIBUTES TO THE POLLEN AND TO HIS ANEMIA  . Sleep apnea    PT STATES TOLD MILD - DID NOT NEED CPAP    Patient Active Problem List   Diagnosis Date Noted  . Bradycardia 05/24/2019  . 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 Syncope 05/24/2019  . Renal colic on right side 05/11/2016  . Hydronephrosis 05/11/2016  . Other microscopic hematuria 05/11/2016  . Sinobronchitis 08/13/2015  . Leukopenia 09/27/2013    Past Surgical History:  Procedure Laterality Date  . HEMORRHOID SURGERY N/A 11/27/2012   Procedure: HEMORRHOIDECTOMY;  Surgeon: Shelly Rubenstein, MD;  Location: [facility];  Service: General;  Laterality: N/A;  . NASAL SINUS SURGERY    . RECTAL POLYPS REMOVED    . ROTATOR CUFF REPAIR Left 2002 OR 2003   STILL HAS SOME PAIN IN SHOULDER  . TONSILLECTOMY         Home Medications    Prior to Admission medications   Medication Sig Start Date End Date Taking? Authorizing Provider  acetaminophen (TYLENOL) 500 MG tablet Take 500 mg by mouth every 6 (six) hours as needed.   Yes [provider]  aspirin 81 MG chewable tablet Chew 81 mg by mouth daily.   Yes [provider]  Ferrous Sulfate (IRON) 325  (65 Fe) MG TABS Take 1 tablet by mouth daily.    [provider]  fluticasone (FLONASE) 50 MCG/ACT nasal spray Place 1 spray into the nose daily. 11/23/12   Coralie Keens, FNP  ibuprofen (ADVIL,MOTRIN) 200 MG tablet Take 400 mg by mouth every 6 (six) hours as needed for moderate pain.    [provider]  loratadine (CLARITIN) 10 MG tablet Take 10 mg by mouth daily.    [provider]  vitamin C (ASCORBIC ACID) 500 MG tablet Take 500 mg by mouth daily.    [provider]    Family History Family History  Problem Relation Age of Onset  . Cancer Mother        brain tumors  . Alzheimer's disease Father     Social History Social History   Tobacco Use  . Smoking status: Never Smoker  . Smokeless tobacco: Never Used  Vaping Use  . Vaping Use: Never used  Substance Use Topics  . Alcohol use: Yes  . Drug use: No     Allergies   Penicillins and Meat [alpha-gal]   Review of Systems Review of Systems  Skin: Positive for wound.  All other systems reviewed and are negative.    Physical Exam Triage Vital  Signs ED Triage Vitals  Enc Vitals Group     BP 01/28/20 1353 116/72     Pulse Rate 01/28/20 1353 78     Resp 01/28/20 1353 18     Temp 01/28/20 1353 98.6 F (37 C)     Temp Source 01/28/20 1353 Oral     SpO2 01/28/20 1353 98 %     Weight --      Height --      Head Circumference --      Peak Flow --      Pain Score 01/28/20 1350 5     Pain Loc --      Pain Edu? --      Excl. in GC? --    No data found.  Updated Vital Signs BP 116/72 (BP Location: Right Arm)   Pulse 78   Temp 98.6 F (37 C) (Oral)   Resp 18   SpO2 98%   Visual Acuity Right Eye Distance:   Left Eye Distance:   Bilateral Distance:    Right Eye Near:   Left Eye Near:    Bilateral Near:     Physical Exam Vitals reviewed.  Musculoskeletal:        General: Normal range of motion.  Skin:    Comments: 10 cm area of mild erythema, 1cm firm center, no  abscess   Neurological:     General: No focal deficit present.     Mental Status: He is alert.  Psychiatric:        Mood and Affect: Mood normal.      UC Treatments / Results  Labs (all labs ordered are listed, but only abnormal results are displayed) Labs Reviewed - No data to display  EKG   Radiology No results found.  Procedures Procedures (including critical care time)  Medications Ordered in UC Medications - No data to display  Initial Impression / Assessment and Plan / UC Course  I have reviewed the triage vital signs and the nursing notes.  Pertinent labs & imaging results that were available during my care of the patient were reviewed by me and considered in my medical decision making (see chart for details).     MDM:  Area appears to be a local reaction Pt advised continue benadryl and put hydrocortisone cream on area.  Final Clinical Impressions(s) / UC Diagnoses   Final diagnoses:  Insect bite of left thigh, initial encounter     Discharge Instructions     Use topical hydrocortisone to area 3 times a day    ED Prescriptions    None     PDMP not reviewed this encounter.  An After Visit Summary was printed and given to the patient.    Elson Areas, [place] 01/28/20 1606

## 2020-01-28 NOTE — Discharge Instructions (Signed)
Use topical hydrocortisone to area 3 times a day

## 2020-02-01 ENCOUNTER — Telehealth (INDEPENDENT_AMBULATORY_CARE_PROVIDER_SITE_OTHER): Payer: Federal, State, Local not specified - PPO | Admitting: Family Medicine

## 2020-02-01 DIAGNOSIS — W57XXXA Bitten or stung by nonvenomous insect and other nonvenomous arthropods, initial encounter: Secondary | ICD-10-CM | POA: Diagnosis not present

## 2020-02-01 DIAGNOSIS — S70361A Insect bite (nonvenomous), right thigh, initial encounter: Secondary | ICD-10-CM

## 2020-02-01 MED ORDER — DOXYCYCLINE HYCLATE 100 MG PO TABS: 100.0000 mg | ORAL_TABLET | Freq: Two times a day (BID) | ORAL | 0 refills | Status: DC

## 2020-02-01 NOTE — Progress Notes (Signed)
MyChart Video visit  Subjective: CC: skin concern PCP: Mechele Claude, MD Andrew Villegas is a 59 y.o. male. Patient provides verbal consent for consult held via video.  Due to COVID-19 pandemic this visit was conducted virtually. This visit type was conducted due to national recommendations for restrictions regarding the COVID-19 Pandemic (e.g. social distancing, sheltering in place) in an effort to limit this patient's exposure and mitigate transmission in our community. All issues noted in this document were discussed and addressed.  A physical exam was not performed with this format.   Location of patient: Work Location of provider: [facility] Others present for call: family member  1.  Insect bite Patient was seen at the [facility] urgent care center on 01/28/2020 for a bite on his right thigh.  At that time, his symptoms are refractory to topical antibiotic ointment.  He had endorsed increased swelling.  The note describes a 10 cm area of mild erythema with a 1 cm firm center and no appreciable abscess formation.  He was instructed to apply topical corticosteroid 3 times a day.  Since that time, he notes that the redness has extended, the induration has also gotten larger and he subsequently had drainage start recently as well.  The drainage appears to be clear/yellow.  He does not report any fevers.  He does report increased pain and what sounds like an enlarged lymph node in the right inguinal canal.  ROS: Per HPI  Allergies  Allergen Reactions  . Penicillins Anaphylaxis    Has patient had a PCN reaction causing immediate rash, facial/tongue/throat swelling, SOB or lightheadedness with hypotension: No Has patient had a PCN reaction causing severe rash involving mucus membranes or skin necrosis: No Has patient had a PCN reaction that required hospitalization No Has patient had a PCN reaction occurring within the last 10 years: No If all of the above answers are "NO", then may proceed with  Cephalosporin use. Childhood allergy  . 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 Arthritis    ALL JOINTS HURT  . Chest pain    HAS CHEST PAINS THAT PT ATTRIBUTES TO HIS ANEMIA --STATES PAST HX OF ABNORMAL EKGS BUT NEGATIVE STRESS TESTS.  Marland Kitchen Hemorrhoid    BLEEDING FROM HEMORRHOIDS FOR YRS  . Iron deficiency anemia   . Leukopenia 09/27/2013  . Shortness of breath    ATTRIBUTES TO THE POLLEN AND TO HIS ANEMIA  . Sleep apnea    PT STATES TOLD MILD - DID NOT NEED CPAP    Current Outpatient Medications:  .  acetaminophen (TYLENOL) 500 MG tablet, Take 500 mg by mouth every 6 (six) hours as needed., Disp: , Rfl:  .  aspirin 81 MG chewable tablet, Chew 81 mg by mouth daily., Disp: , Rfl:  .  Ferrous Sulfate (IRON) 325 (65 Fe) MG TABS, Take 1 tablet by mouth daily., Disp: , Rfl:  .  fluticasone (FLONASE) 50 MCG/ACT nasal spray, Place 1 spray into the nose daily., Disp: 16 g, Rfl: 6 .  ibuprofen (ADVIL,MOTRIN) 200 MG tablet, Take 400 mg by mouth every 6 (six) hours as needed for moderate pain., Disp: , Rfl:  .  loratadine (CLARITIN) 10 MG tablet, Take 10 mg by mouth daily., Disp: , Rfl:  .  vitamin C (ASCORBIC ACID) 500 MG tablet, Take 500 mg by mouth daily., Disp: , Rfl:   Gen: non toxic appearing, AO Skin: large area of erythema, induration and a central punctum with serous  drainage.  Assessment/ Plan: 59 y.o. male   1. Bug bite with infection, initial encounter Appears to be a soft tissue infection.  STOP corticosteroid.  Continue compresses, keeping wound clean.  Start Doxycycline 100 mg twice daily prescribed.  I have informed him that he should take 200 mg as a single dose today then start with 1 tablet every 12 hours tomorrow.  I consider giving him a dose of Rocephin but unfortunately he has had anaphylaxis to penicillin so I hesitate to give him a cephalosporin since he is cephalosporin nave.  I had him draw borders around the lesion today.  We discussed that if  the redness extends beyond this border, low threshold to be evaluated in the emergency department.  He voiced good understanding of the plan.  He will contact me over the weekend if any concerns arise.   Start time: 1:27pm End time: 1:35pm  Total time spent on patient care (including video visit/ documentation): 15 minutes  Andrew [place] Hulen Skains, DO [facility] [phone]

## 2020-02-05 ENCOUNTER — Encounter: Payer: Self-pay | Admitting: Family Medicine

## 2020-02-08 ENCOUNTER — Telehealth: Payer: Self-pay | Admitting: Family Medicine

## 2020-02-08 ENCOUNTER — Encounter (HOSPITAL_BASED_OUTPATIENT_CLINIC_OR_DEPARTMENT_OTHER): Payer: Self-pay

## 2020-02-08 ENCOUNTER — Other Ambulatory Visit: Payer: Self-pay

## 2020-02-08 ENCOUNTER — Emergency Department (HOSPITAL_BASED_OUTPATIENT_CLINIC_OR_DEPARTMENT_OTHER)
Admission: EM | Admit: 2020-02-08 | Discharge: 2020-02-08 | Disposition: A | Discharge status: Home / Self Care | Payer: Federal, State, Local not specified - PPO | Attending: Emergency Medicine | Admitting: Emergency Medicine

## 2020-02-08 DIAGNOSIS — Y9389 Activity, other specified: Secondary | ICD-10-CM | POA: Insufficient documentation

## 2020-02-08 DIAGNOSIS — L0390 Cellulitis, unspecified: Secondary | ICD-10-CM

## 2020-02-08 DIAGNOSIS — L03115 Cellulitis of right lower limb: Secondary | ICD-10-CM | POA: Diagnosis not present

## 2020-02-08 DIAGNOSIS — W57XXXA Bitten or stung by nonvenomous insect and other nonvenomous arthropods, initial encounter: Secondary | ICD-10-CM | POA: Diagnosis not present

## 2020-02-08 DIAGNOSIS — S70361A Insect bite (nonvenomous), right thigh, initial encounter: Secondary | ICD-10-CM | POA: Insufficient documentation

## 2020-02-08 DIAGNOSIS — Y9289 Other specified places as the place of occurrence of the external cause: Secondary | ICD-10-CM | POA: Insufficient documentation

## 2020-02-08 DIAGNOSIS — Y999 Unspecified external cause status: Secondary | ICD-10-CM | POA: Insufficient documentation

## 2020-02-08 MED ORDER — CLINDAMYCIN HCL 300 MG PO CAPS: 300.0000 mg | ORAL_CAPSULE | Freq: Three times a day (TID) | ORAL | 0 refills | Status: AC

## 2020-02-08 NOTE — Telephone Encounter (Signed)
Quite possibly. Can he be seen?  I'd like someone to lay eyes on him.

## 2020-02-08 NOTE — ED Triage Notes (Signed)
Pt has an insect bite to the medial side of the R thigh. Pt was placed on doxycyline for 1 week. Pt states the area does not look much better.

## 2020-02-08 NOTE — Telephone Encounter (Signed)
Pt states he is still on the antibiotic and will finish them on Sunday but pt states the area is still red and has lots of purulent drainage. Does he need more antibiotics?

## 2020-02-08 NOTE — Telephone Encounter (Signed)
Pt states he will go to the ER for evaluation.

## 2020-02-08 NOTE — Telephone Encounter (Signed)
  Incoming Patient Call  02/08/2020  What symptoms do you have? Had video call with [facility] 7-16 for bite on right upper leg and it is still red and showing infection. May need some more ABX called in to help with infection.  How long have you been sick? 7-16  Have you been seen for this problem? 7-16  If your provider decides to give you a prescription, which pharmacy would you like for it to be sent to? [facility] in [place]   Patient informed that this information will be sent to the clinical staff for review and that they should receive a follow up call.

## 2020-02-08 NOTE — ED Provider Notes (Signed)
[facility] EMERGENCY DEPARTMENT Provider Note   CSN: [id number] Arrival date & time: 02/08/20  1555     History Chief Complaint  Patient presents with  . Insect Bite    Andrew Villegas is a 59 y.o. male.  The history is provided by the patient.  Illness Location:  Right thigh Severity:  Mild Onset quality:  Gradual Timing:  Constant Progression:  Partially resolved Chronicity:  New Context:  Patient with insect bite/skin infection on right thigh Relieved by:  Antibiotics Worsened by:  Nothing  Associated symptoms: no fever and no rash        Past Medical History:  Diagnosis Date  . Arthritis    ALL JOINTS HURT  . Chest pain    HAS CHEST PAINS THAT PT ATTRIBUTES TO HIS ANEMIA --STATES PAST HX OF ABNORMAL EKGS BUT NEGATIVE STRESS TESTS.  Marland Kitchen Hemorrhoid    BLEEDING FROM HEMORRHOIDS FOR YRS  . Iron deficiency anemia   . Leukopenia 09/27/2013  . Shortness of breath    ATTRIBUTES TO THE POLLEN AND TO HIS ANEMIA  . Sleep apnea    PT STATES TOLD MILD - DID NOT NEED CPAP    Patient Active Problem List   Diagnosis Date Noted  . Bradycardia 05/24/2019  . 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 Syncope 05/24/2019  . Renal colic on right side 05/11/2016  . Hydronephrosis 05/11/2016  . Other microscopic hematuria 05/11/2016  . Sinobronchitis 08/13/2015  . Leukopenia 09/27/2013    Past Surgical History:  Procedure Laterality Date  . HEMORRHOID SURGERY N/A 11/27/2012   Procedure: HEMORRHOIDECTOMY;  Surgeon: Shelly Rubenstein, MD;  Location: [facility];  Service: General;  Laterality: N/A;  . NASAL SINUS SURGERY    . RECTAL POLYPS REMOVED    . ROTATOR CUFF REPAIR Left 2002 OR 2003   STILL HAS SOME PAIN IN SHOULDER  . TONSILLECTOMY         Family History  Problem Relation Age of Onset  . Cancer Mother        brain tumors  . Alzheimer's disease Father     Social History   Tobacco Use  . Smoking status: Never Smoker  . Smokeless tobacco: Never Used    Vaping Use  . Vaping Use: Never used  Substance Use Topics  . Alcohol use: Yes  . Drug use: No    Home Medications Prior to Admission medications   Medication Sig Start Date End Date Taking? Authorizing Provider  acetaminophen (TYLENOL) 500 MG tablet Take 500 mg by mouth every 6 (six) hours as needed.    [provider]  aspirin 81 MG chewable tablet Chew 81 mg by mouth daily.    [provider]  clindamycin (CLEOCIN) 300 MG capsule Take 1 capsule (300 mg total) by mouth 3 (three) times daily for 10 days. 02/08/20 02/18/20  Corrie Brannen, DO  doxycycline (VIBRA-TABS) 100 MG tablet Take 1 tablet (100 mg total) by mouth 2 (two) times daily. 02/01/20   Raliegh Ip, DO  Ferrous Sulfate (IRON) 325 (65 Fe) MG TABS Take 1 tablet by mouth daily.    [provider]  fluticasone (FLONASE) 50 MCG/ACT nasal spray Place 1 spray into the nose daily. 11/23/12   Coralie Keens, FNP  ibuprofen (ADVIL,MOTRIN) 200 MG tablet Take 400 mg by mouth every 6 (six) hours as needed for moderate pain.    [provider]  loratadine (CLARITIN) 10 MG tablet Take 10 mg by mouth daily.  [provider]  vitamin C (ASCORBIC ACID) 500 MG tablet Take 500 mg by mouth daily.    [provider]    Allergies    Penicillins and Meat [alpha-gal]  Review of Systems   Review of Systems  Constitutional: Negative for fever.  Cardiovascular: Negative for leg swelling.  Skin: Positive for color change and wound. Negative for pallor and rash.    Physical Exam Updated Vital Signs BP 106/78   Pulse 77   Temp 97.9 F (36.6 C) (Oral)   Resp 18   Ht 6' (1.829 m)   Wt 88.5 kg   SpO2 100%   BMI 26.45 kg/m   Physical Exam Constitutional:      General: He is not in acute distress.    Appearance: He is not ill-appearing.  Cardiovascular:     Pulses: Normal pulses.  Skin:    General: Skin is warm.     Capillary Refill: Capillary refill takes less than 2  seconds.     Findings: Erythema present.     Comments: Right thigh with small area of redness with central area of some granulation tissue  Neurological:     Mental Status: He is alert.     ED Results / Procedures / Treatments   Labs (all labs ordered are listed, but only abnormal results are displayed) Labs Reviewed - No data to display  EKG None  Radiology No results found.  Procedures Procedures (including critical care time)  Medications Ordered in ED Medications - No data to display  ED Course  I have reviewed the triage vital signs and the nursing notes.  Pertinent labs & imaging results that were available during my care of the patient were reviewed by me and considered in my medical decision making (see chart for details).    MDM Rules/Calculators/A&P                          Andrew Villegas is a 59 year old male here with right leg wound.  Patient with normal vitals.  No fever.  Has been on doxycycline for insect bite on the right thigh.  Overall he states redness has improved but still with some drainage and redness.  Antibiotics almost finished.  Overall it appears that he might had had a little abscess that also started to drain. Some cellulitis changes but overall does not appear to spread up and down the leg.  It is fairly localized.  Overall the redness has improved as there was an outline around original redness.  We will switch antibiotics to clindamycin.  Understands return precautions and discharged in ED in good condition.  No crepitus, no concern for deeper space infection.  Overall fairly superficial.  This chart was dictated using voice recognition software.  Despite best efforts to proofread,  errors can occur which can change the documentation meaning.   Final Clinical Impression(s) / ED Diagnoses Final diagnoses:  Cellulitis, unspecified cellulitis site    Rx / DC Orders ED Discharge Orders         Ordered    clindamycin (CLEOCIN) 300 MG capsule   3 times daily     Discontinue  Reprint     02/08/20 1841           Virgina Norfolk, DO 02/08/20 1845

## 2020-02-08 NOTE — Telephone Encounter (Signed)
Is he willing to see ER?  I'm worried he may need IV abx if he is still having significant purulence

## 2020-02-08 NOTE — Telephone Encounter (Signed)
We don't have any openings left today. I can try and see if he could come in on Monday with Dr Darlyn Read?

## 2020-02-08 NOTE — ED Notes (Signed)
ED Provider at bedside. 

## 2020-04-03 ENCOUNTER — Other Ambulatory Visit: Payer: Federal, State, Local not specified - PPO

## 2020-04-03 ENCOUNTER — Other Ambulatory Visit: Payer: Self-pay | Admitting: Critical Care Medicine

## 2020-04-03 DIAGNOSIS — Z20822 Contact with and (suspected) exposure to covid-19: Secondary | ICD-10-CM

## 2020-04-05 LAB — NOVEL CORONAVIRUS, NAA: SARS-CoV-2, NAA: NOT DETECTED

## 2020-04-05 LAB — SARS-COV-2, NAA 2 DAY TAT

## 2020-06-16 ENCOUNTER — Encounter: Payer: Self-pay | Admitting: Family

## 2020-06-16 ENCOUNTER — Ambulatory Visit (INDEPENDENT_AMBULATORY_CARE_PROVIDER_SITE_OTHER): Payer: Federal, State, Local not specified - PPO | Admitting: Family

## 2020-06-16 DIAGNOSIS — H65192 Other acute nonsuppurative otitis media, left ear: Secondary | ICD-10-CM

## 2020-06-16 MED ORDER — CETIRIZINE HCL 10 MG PO TABS: 10.0000 mg | ORAL_TABLET | Freq: Every day | ORAL | 11 refills | Status: AC

## 2020-06-16 MED ORDER — DOXYCYCLINE HYCLATE 100 MG PO TABS: 100.0000 mg | ORAL_TABLET | Freq: Two times a day (BID) | ORAL | 0 refills | Status: DC

## 2020-06-16 NOTE — Progress Notes (Signed)
   Virtual Visit via telephone Note Due to COVID-19 pandemic this visit was conducted virtually. This visit type was conducted due to national recommendations for restrictions regarding the COVID-19 Pandemic (e.g. social distancing, sheltering in place) in an effort to limit this patient's exposure and mitigate transmission in our community. All issues noted in this document were discussed and addressed.  A physical exam was not performed with this format.  I connected with [name] on 06/16/20 at 10:07 AM by telephone and verified that I am speaking with the correct person using two identifiers. [name] is currently located at the store and no one is currently with him  during visit. The provider, Jannifer Rodney, FNP is located in their office at time of visit.  I discussed the limitations, risks, security and privacy concerns of performing an evaluation and management service by telephone and the availability of in person appointments. I also discussed with the patient that there may be a patient responsible charge related to this service. The patient expressed understanding and agreed to proceed.   History and Present Illness:  Otalgia  There is pain in the left ear. This is a new problem. The current episode started yesterday. The problem occurs constantly. The problem has been waxing and waning. There has been no fever. The pain is at a severity of 3/10. The pain is mild. Associated symptoms include ear discharge, hearing loss and a sore throat (left). Pertinent negatives include no coughing, headaches or rhinorrhea. He has tried NSAIDs for the symptoms. The treatment provided mild relief.      Review of Systems  HENT: Positive for ear discharge, ear pain, hearing loss and sore throat (left). Negative for rhinorrhea.   Respiratory: Negative for cough.   Neurological: Negative for headaches.  All other systems reviewed and are negative.    Observations/Objective: No SOB or  distress noted   Assessment and Plan: 1. Other acute nonsuppurative otitis media of left ear, recurrence not specified Tylenol as needed Force fluids RTO if symptoms worsen or do not improve  - doxycycline (VIBRA-TABS) 100 MG tablet; Take 1 tablet (100 mg total) by mouth 2 (two) times daily.  Dispense: 20 tablet; Refill: 0 - cetirizine (ZYRTEC) 10 MG tablet; Take 1 tablet (10 mg total) by mouth daily.  Dispense: 30 tablet; Refill: 11     I discussed the assessment and treatment plan with the patient. The patient was provided an opportunity to ask questions and all were answered. The patient agreed with the plan and demonstrated an understanding of the instructions.   The patient was advised to call back or seek an in-person evaluation if the symptoms worsen or if the condition fails to improve as anticipated.  The above assessment and management plan was discussed with the patient. The patient verbalized understanding of and has agreed to the management plan. Patient is aware to call the clinic if symptoms persist or worsen. Patient is aware when to return to the clinic for a follow-up visit. Patient educated on when it is appropriate to go to the emergency department.   Time call ended:  10:18 Am   I provided 11 minutes of non-face-to-face time during this encounter.    Jannifer Rodney, FNP

## 2020-11-26 ENCOUNTER — Ambulatory Visit: Payer: Federal, State, Local not specified - PPO | Admitting: Nurse Practitioner

## 2020-11-26 ENCOUNTER — Encounter: Payer: Self-pay | Admitting: Nurse Practitioner

## 2020-11-26 DIAGNOSIS — R110 Nausea: Secondary | ICD-10-CM | POA: Insufficient documentation

## 2020-11-26 DIAGNOSIS — R197 Diarrhea, unspecified: Secondary | ICD-10-CM | POA: Diagnosis not present

## 2020-11-26 MED ORDER — ONDANSETRON HCL 4 MG PO TABS: 4.0000 mg | ORAL_TABLET | Freq: Three times a day (TID) | ORAL | 0 refills | Status: DC | PRN

## 2020-11-26 MED ORDER — LOPERAMIDE HCL 2 MG PO TABS: 2.0000 mg | ORAL_TABLET | Freq: Four times a day (QID) | ORAL | 0 refills | Status: AC | PRN

## 2020-11-26 NOTE — Progress Notes (Signed)
   Virtual Visit  Note Due to COVID-19 pandemic this visit was conducted virtually. This visit type was conducted due to national recommendations for restrictions regarding the COVID-19 Pandemic (e.g. social distancing, sheltering in place) in an effort to limit this patient's exposure and mitigate transmission in our community. All issues noted in this document were discussed and addressed.  A physical exam was not performed with this format.  I connected with Andrew Villegas on 11/26/20 at  11:20 AM by telephone and verified that I am speaking with the correct person using two identifiers. Andrew Villegas is currently located at home and wife is with patient during visit. The provider, Daryll Drown, NP is located in their office at time of visit.  I discussed the limitations, risks, security and privacy concerns of performing an evaluation and management service by telephone and the availability of in person appointments. I also discussed with the patient that there may be a patient responsible charge related to this service. The patient expressed understanding and agreed to proceed.   History and Present Illness:  Diarrhea  This is a new problem. The current episode started yesterday. The problem occurs less than 2 times per day. The problem has been gradually worsening. The stool consistency is described as watery. Associated symptoms include abdominal pain and myalgias. Pertinent negatives include no chills or fever. Associated symptoms comments: Nausea. Nothing aggravates the symptoms. He has tried nothing for the symptoms. There is no history of bowel resection, inflammatory bowel disease or malabsorption.      Review of Systems  Constitutional: Negative for chills, fever and malaise/fatigue.  Gastrointestinal: Positive for abdominal pain and diarrhea.  Musculoskeletal: Positive for myalgias.  Skin: Negative for rash.  All other systems reviewed and are  negative.    Observations/Objective: Televisit- patient sounded very weak but not in immediate distress.  Assessment and Plan: Diarrhea COVID-19 test ordered with results pending.  Zofran for nausea, Imodium to help with diarrhea.  Increase hydration, BART diet recommended.  Nausea Nausea and diarrhea worsened in the last 24 hours.  Patient is experiencing increased increased weakness and unable to keep any food down.  COVID-19 test ordered with results pending.  Zofran for nausea, Imodium to help with diarrhea.  Increase hydration, BART diet recommended.  Advised patient/caregiver to seek emergency care if symptoms becomes worse through the night and call back in 24 hours.  Rx sent to pharmacy.  Education provided to patient and spouse verbalizes understanding.  Follow Up Instructions: Follow-up with worsening or unresolved symptoms    I discussed the assessment and treatment plan with the patient. The patient was provided an opportunity to ask questions and all were answered. The patient agreed with the plan and demonstrated an understanding of the instructions.   The patient was advised to call back or seek an in-person evaluation if the symptoms worsen or if the condition fails to improve as anticipated.  The above assessment and management plan was discussed with the patient. The patient verbalized understanding of and has agreed to the management plan. Patient is aware to call the clinic if symptoms persist or worsen. Patient is aware when to return to the clinic for a follow-up visit. Patient educated on when it is appropriate to go to the emergency department.   Time call ended: 11:31 AM  I provided 11 minutes of  non face-to-face time during this encounter.    Daryll Drown, NP

## 2020-11-26 NOTE — Assessment & Plan Note (Addendum)
Nausea and diarrhea worsened in the last 24 hours.  Patient is experiencing increased increased weakness and unable to keep any food down.  COVID-19 test ordered with results pending.  Zofran for nausea, Imodium to help with diarrhea.  Increase hydration, BART diet recommended.  Advised patient/caregiver to seek emergency care if symptoms becomes worse through the night and call back in 24 hours.  Rx sent to pharmacy.  Education provided to patient and spouse verbalizes understanding.

## 2020-11-26 NOTE — Assessment & Plan Note (Signed)
COVID-19 test ordered with results pending.  Zofran for nausea, Imodium to help with diarrhea.  Increase hydration, BART diet recommended.

## 2020-11-27 LAB — SARS-COV-2, NAA 2 DAY TAT

## 2020-11-27 LAB — NOVEL CORONAVIRUS, NAA: SARS-CoV-2, NAA: NOT DETECTED

## 2021-02-26 DIAGNOSIS — Z20828 Contact with and (suspected) exposure to other viral communicable diseases: Secondary | ICD-10-CM | POA: Diagnosis not present

## 2021-02-28 DIAGNOSIS — Z20822 Contact with and (suspected) exposure to covid-19: Secondary | ICD-10-CM | POA: Diagnosis not present

## 2021-03-02 ENCOUNTER — Encounter: Payer: Self-pay | Admitting: Family Medicine

## 2021-03-02 ENCOUNTER — Telehealth (INDEPENDENT_AMBULATORY_CARE_PROVIDER_SITE_OTHER): Payer: Federal, State, Local not specified - PPO | Admitting: Family Medicine

## 2021-03-02 DIAGNOSIS — U071 COVID-19: Secondary | ICD-10-CM | POA: Diagnosis not present

## 2021-03-02 MED ORDER — DICLOFENAC SODIUM 75 MG PO TBEC
75.0000 mg | DELAYED_RELEASE_TABLET | Freq: Two times a day (BID) | ORAL | 0 refills | Status: AC
Start: 2021-03-02 — End: ?

## 2021-03-02 MED ORDER — PREDNISONE 20 MG PO TABS: 40.0000 mg | ORAL_TABLET | Freq: Every day | ORAL | 0 refills | Status: AC

## 2021-03-02 NOTE — Progress Notes (Signed)
   Virtual Visit  Note Due to COVID-19 pandemic this visit was conducted virtually. This visit type was conducted due to national recommendations for restrictions regarding the COVID-19 Pandemic (e.g. social distancing, sheltering in place) in an effort to limit this patient's exposure and mitigate transmission in our community. All issues noted in this document were discussed and addressed.  A physical exam was not performed with this format.  I connected with Andrew Villegas on 03/02/21 at [phone] by telephone and verified that I am speaking with the correct person using two identifiers. Andrew Villegas is currently located at home and his wife is currently with him during the visit. The provider, Gabriel Earing, FNP is located in their office at time of visit.  I discussed the limitations, risks, security and privacy concerns of performing an evaluation and management service by telephone and the availability of in person appointments. I also discussed with the patient that there may be a patient responsible charge related to this service. The patient expressed understanding and agreed to proceed.  CC: Covid   History and Present Illness:  HPI Andrew Villegas reports a positive rapid test at [facility] 2 days ago. His symptoms started 4 days ago. He reports chills, body aches, cough, fatigue, and congestion. Overall he feels like his symptoms are getting better except for his body aches. He denies chest pain, shortness of breath, nausea, vomiting, diarrhea, or fever. He has not taken anything for his symptoms. His wife currently has Covid as well.     ROS As per HPI.   Observations/Objective: Alert and oriented x 3. Able to speak in full sentences without difficulty.   Assessment and Plan: Demarlo was seen today for covid positive.  Diagnoses and all orders for this visit:  COVID No significant health conditions to qualify for antiviral therapy. Discussed symptomatic care. Prednisone burst as below.  Voltaren also ordered for body aches. Discussed quarantine, return precautions, and when to seek emergency care.  -     predniSONE (DELTASONE) 20 MG tablet; Take 2 tablets (40 mg total) by mouth daily with breakfast for 5 days. -     diclofenac (VOLTAREN) 75 MG EC tablet; Take 1 tablet (75 mg total) by mouth 2 (two) times daily.    Follow Up Instructions: Return to office for new or worsening symptoms, or if symptoms persist.      I discussed the assessment and treatment plan with the patient. The patient was provided an opportunity to ask questions and all were answered. The patient agreed with the plan and demonstrated an understanding of the instructions.   The patient was advised to call back or seek an in-person evaluation if the symptoms worsen or if the condition fails to improve as anticipated.  The above assessment and management plan was discussed with the patient. The patient verbalized understanding of and has agreed to the management plan. Patient is aware to call the clinic if symptoms persist or worsen. Patient is aware when to return to the clinic for a follow-up visit. Patient educated on when it is appropriate to go to the emergency department.   Time call ended:  0937  I provided 12 minutes of  non face-to-face time during this encounter.    Gabriel Earing, FNP

## 2021-05-23 ENCOUNTER — Emergency Department (HOSPITAL_COMMUNITY): Payer: Federal, State, Local not specified - PPO

## 2021-05-23 ENCOUNTER — Other Ambulatory Visit: Payer: Self-pay

## 2021-05-23 ENCOUNTER — Emergency Department (HOSPITAL_COMMUNITY)
Admission: EM | Admit: 2021-05-23 | Discharge: 2021-05-23 | Disposition: A | Discharge status: Home / Self Care | Payer: Federal, State, Local not specified - PPO | Attending: Emergency Medicine | Admitting: Emergency Medicine

## 2021-05-23 ENCOUNTER — Encounter (HOSPITAL_COMMUNITY): Payer: Self-pay

## 2021-05-23 DIAGNOSIS — S5002XA Contusion of left elbow, initial encounter: Secondary | ICD-10-CM | POA: Diagnosis not present

## 2021-05-23 DIAGNOSIS — Z7982 Long term (current) use of aspirin: Secondary | ICD-10-CM | POA: Diagnosis not present

## 2021-05-23 DIAGNOSIS — M25511 Pain in right shoulder: Secondary | ICD-10-CM | POA: Insufficient documentation

## 2021-05-23 DIAGNOSIS — M25512 Pain in left shoulder: Secondary | ICD-10-CM | POA: Diagnosis not present

## 2021-05-23 DIAGNOSIS — S4991XA Unspecified injury of right shoulder and upper arm, initial encounter: Secondary | ICD-10-CM | POA: Diagnosis not present

## 2021-05-23 DIAGNOSIS — R000 Tachycardia, unspecified: Secondary | ICD-10-CM | POA: Insufficient documentation

## 2021-05-23 DIAGNOSIS — Y92410 Unspecified street and highway as the place of occurrence of the external cause: Secondary | ICD-10-CM | POA: Insufficient documentation

## 2021-05-23 DIAGNOSIS — T07XXXA Unspecified multiple injuries, initial encounter: Secondary | ICD-10-CM

## 2021-05-23 DIAGNOSIS — S40022A Contusion of left upper arm, initial encounter: Secondary | ICD-10-CM | POA: Diagnosis not present

## 2021-05-23 DIAGNOSIS — S40021A Contusion of right upper arm, initial encounter: Secondary | ICD-10-CM | POA: Diagnosis not present

## 2021-05-23 DIAGNOSIS — M7989 Other specified soft tissue disorders: Secondary | ICD-10-CM | POA: Diagnosis not present

## 2021-05-23 MED ORDER — HYDROCODONE-ACETAMINOPHEN 5-325 MG PO TABS
1.0000 | ORAL_TABLET | Freq: Once | ORAL | Status: AC
Start: 2021-05-23 — End: 2021-05-23
  Administered 2021-05-23: 1 via ORAL
  Filled 2021-05-23: qty 1
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
## 2021-05-23 NOTE — Discharge Instructions (Signed)
The Xrays look normal. No signs of fracture.  We suspect that you likely have deep bruising in your muscle and bone and possibly ligamentous injury.  We recommend that you take ibuprofen 600 mg every 6-8 hours for the next 3 to 5 days and apply ice frequently to your injured extremities.  Follow-up with the orthopedist if your symptoms continue in 14 days.

## 2021-06-25 ENCOUNTER — Ambulatory Visit (INDEPENDENT_AMBULATORY_CARE_PROVIDER_SITE_OTHER): Payer: Federal, State, Local not specified - PPO | Admitting: Family Medicine

## 2021-06-25 VITALS — BP 110/63 | HR 114 | Temp 101.4°F | Ht 72.0 in | Wt 216.5 lb

## 2021-06-25 DIAGNOSIS — R509 Fever, unspecified: Secondary | ICD-10-CM | POA: Diagnosis not present

## 2021-06-25 DIAGNOSIS — J101 Influenza due to other identified influenza virus with other respiratory manifestations: Secondary | ICD-10-CM | POA: Diagnosis not present

## 2021-06-25 MED ORDER — OSELTAMIVIR PHOSPHATE 75 MG PO CAPS: 75.0000 mg | ORAL_CAPSULE | Freq: Two times a day (BID) | ORAL | 0 refills | Status: AC

## 2021-06-25 MED ORDER — PREDNISONE 20 MG PO TABS: 40.0000 mg | ORAL_TABLET | Freq: Every day | ORAL | 0 refills | Status: AC

## 2021-06-25 NOTE — Progress Notes (Signed)
Acute Office Visit  Subjective:    Patient ID: Andrew Villegas, male    DOB: 06/18/1961, 61 y.o.   MRN: [id number]  Chief Complaint  Patient presents with   Fever     HPI Patient is in today for congestion and cough x 1 day. He has had a fever 102 today. Also reports body aches and chills. He feels very fatigued and weak. His appetite has been decreased. He reports some mild shortness of breath. He denies wheezing or chest pain. Denies pulmonary history. He has been staying well hydrated. He has used nasal spray for his symptoms. He had a negative home Covid test today.   Past Medical History:  Diagnosis Date   Arthritis    ALL JOINTS HURT   Chest pain    HAS CHEST PAINS THAT PT ATTRIBUTES TO HIS ANEMIA --STATES PAST HX OF ABNORMAL EKGS BUT NEGATIVE STRESS TESTS.   Hemorrhoid    BLEEDING FROM HEMORRHOIDS FOR YRS   Iron deficiency anemia    Leukopenia 09/27/2013   Shortness of breath    ATTRIBUTES TO THE POLLEN AND TO HIS ANEMIA   Sleep apnea    PT STATES TOLD MILD - DID NOT NEED CPAP    Past Surgical History:  Procedure Laterality Date   HEMORRHOID SURGERY N/A 11/27/2012   Procedure: HEMORRHOIDECTOMY;  Surgeon: Shelly Rubenstein, MD;  Location: [facility];  Service: General;  Laterality: N/A;   NASAL SINUS SURGERY     RECTAL POLYPS REMOVED     ROTATOR CUFF REPAIR Left 2002 OR 2003   STILL HAS SOME PAIN IN SHOULDER   TONSILLECTOMY      Family History  Problem Relation Age of Onset   Cancer Mother        brain tumors   Alzheimer's disease Father     Social History   Socioeconomic History   Marital status: Married    Spouse name: Not on file   Number of children: Not on file   Years of education: Not on file   Highest education level: Not on file  Occupational History   Not on file  Tobacco Use   Smoking status: Never   Smokeless tobacco: Never  Vaping Use   Vaping Use: Never used  Substance and Sexual Activity   Alcohol use: Yes   Drug use: No   Sexual  activity: Not on file  Other Topics Concern   Not on file  Social History Narrative   Not on file   Social Determinants of Health   Financial Resource Strain: Not on file  Food Insecurity: Not on file  Transportation Needs: Not on file  Physical Activity: Not on file  Stress: Not on file  Social Connections: Not on file  Intimate Partner Violence: Not on file    Outpatient Medications Prior to Visit  Medication Sig Dispense Refill   acetaminophen (TYLENOL) 500 MG tablet Take 500 mg by mouth every 6 (six) hours as needed.     aspirin 81 MG chewable tablet Chew 81 mg by mouth daily.     B Complex Vitamins (B COMPLEX PO) Take by mouth.     cetirizine (ZYRTEC) 10 MG tablet Take 1 tablet (10 mg total) by mouth daily. 30 tablet 11   Ferrous Sulfate (IRON) 325 (65 Fe) MG TABS Take 1 tablet by mouth daily.     fluticasone (FLONASE) 50 MCG/ACT nasal spray Place 1 spray into the nose daily. 16 g 6   ibuprofen (ADVIL,MOTRIN) 200  MG tablet Take 400 mg by mouth every 6 (six) hours as needed for moderate pain.     loperamide (IMODIUM A-D) 2 MG tablet Take 1 tablet (2 mg total) by mouth 4 (four) times daily as needed for diarrhea or loose stools. 30 tablet 0   vitamin C (ASCORBIC ACID) 500 MG tablet Take 500 mg by mouth daily.     VITAMIN D PO Take by mouth.     diclofenac (VOLTAREN) 75 MG EC tablet Take 1 tablet (75 mg total) by mouth 2 (two) times daily. (Patient not taking: Reported on 06/25/2021) 15 tablet 0   doxycycline (VIBRA-TABS) 100 MG tablet Take 1 tablet (100 mg total) by mouth 2 (two) times daily. 20 tablet 0   ondansetron (ZOFRAN) 4 MG tablet Take 1 tablet (4 mg total) by mouth every 8 (eight) hours as needed for nausea or vomiting. 20 tablet 0   No facility-administered medications prior to visit.    Allergies  Allergen Reactions   Penicillins Anaphylaxis    Has patient had a PCN reaction causing immediate rash, facial/tongue/throat swelling, SOB or lightheadedness with  hypotension: No Has patient had a PCN reaction causing severe rash involving mucus membranes or skin necrosis: No Has patient had a PCN reaction that required hospitalization No Has patient had a PCN reaction occurring within the last 10 years: No If all of the above answers are "NO", then may proceed with Cephalosporin use. Childhood allergy   Meat [Alpha-Gal]     Review of Systems As per HPI.    Objective:    Physical Exam Vitals and nursing note reviewed.  Constitutional:      General: He is not in acute distress.    Appearance: He is ill-appearing. He is not toxic-appearing or diaphoretic.  HENT:     Right Ear: Tympanic membrane, ear canal and external ear normal.     Left Ear: Tympanic membrane, ear canal and external ear normal.     Nose: Congestion present.     Mouth/Throat:     Mouth: Mucous membranes are moist.     Pharynx: Oropharynx is clear.  Cardiovascular:     Rate and Rhythm: Regular rhythm. Tachycardia present.     Heart sounds: Normal heart sounds. No murmur heard. Pulmonary:     Effort: Pulmonary effort is normal. No respiratory distress.     Breath sounds: Normal breath sounds. No wheezing, rhonchi or rales.  Abdominal:     General: There is no distension.     Palpations: Abdomen is soft.     Tenderness: There is no abdominal tenderness. There is no guarding or rebound.  Musculoskeletal:     Right lower leg: No edema.     Left lower leg: No edema.  Skin:    General: Skin is warm and dry.  Neurological:     Mental Status: He is alert and oriented to person, place, and time.  Psychiatric:        Mood and Affect: Mood normal.        Behavior: Behavior normal.    BP 110/63   Pulse (!) 114   Temp (!) 101.4 F (38.6 C) (Temporal)   Ht 6' (1.829 m)   Wt 216 lb 8 oz (98.2 kg)   SpO2 94%   BMI 29.36 kg/m  Wt Readings from Last 3 Encounters:  06/25/21 216 lb 8 oz (98.2 kg)  05/23/21 205 lb (93 kg)  02/08/20 195 lb (88.5 kg)    Health Maintenance  Due  Topic Date Due   HIV Screening  Never done   Hepatitis C Screening  Never done   COLONOSCOPY (Pts 45-70yrs Insurance coverage will need to be confirmed)  Never done   Zoster Vaccines- Shingrix (1 of 2) Never done   INFLUENZA VACCINE  02/16/2021    There are no preventive care reminders to display for this patient.   Lab Results  Component Value Date   TSH 2.800 05/24/2019   Lab Results  Component Value Date   WBC 4.3 05/24/2019   HGB 14.9 05/24/2019   HCT 43.1 05/24/2019   MCV 88 05/24/2019   PLT 185 05/24/2019   Lab Results  Component Value Date   NA 139 05/24/2019   K 4.2 05/24/2019   CHLORIDE 108 09/20/2013   CO2 22 05/24/2019   GLUCOSE 87 05/24/2019   BUN 14 05/24/2019   CREATININE 1.04 05/24/2019   BILITOT 0.6 05/24/2019   ALKPHOS 80 05/24/2019   AST 22 05/24/2019   ALT 20 05/24/2019   PROT 6.8 05/24/2019   ALBUMIN 4.0 05/24/2019   CALCIUM 9.3 05/24/2019   ANIONGAP 7 05/11/2016   GFR 70.66 10/02/2012   Lab Results  Component Value Date   CHOL 210 (H) 05/24/2019   Lab Results  Component Value Date   HDL 43 05/24/2019   Lab Results  Component Value Date   LDLCALC 132 (H) 05/24/2019   Lab Results  Component Value Date   TRIG 198 (H) 05/24/2019   Lab Results  Component Value Date   CHOLHDL 4.9 05/24/2019   No results found for: HGBA1C     Assessment & Plan:   Andrew Villegas was seen today for fever.  Diagnoses and all orders for this visit:  Influenza A Positive flu today. Lungs clear on exam but oxygen saturation is 94% today. Prednisone burst given for this. Tamiflu ordered. Discussed hydration. Discussed symptomatic care and return precautions.  -     Veritor Flu A/B Waived -     oseltamivir (TAMIFLU) 75 MG capsule; Take 1 capsule (75 mg total) by mouth 2 (two) times daily for 5 days. -     predniSONE (DELTASONE) 20 MG tablet; Take 2 tablets (40 mg total) by mouth daily with breakfast for 5 days.  The patient indicates understanding of  these issues and agrees with the plan.  Gabriel Earing, FNP

## 2021-06-26 LAB — VERITOR FLU A/B WAIVED
Influenza A: POSITIVE — AB
Influenza B: NEGATIVE
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
## 2022-03-08 DIAGNOSIS — H10013 Acute follicular conjunctivitis, bilateral: Secondary | ICD-10-CM | POA: Diagnosis not present

## 2022-10-08 DIAGNOSIS — H10013 Acute follicular conjunctivitis, bilateral: Secondary | ICD-10-CM | POA: Diagnosis not present
